# Patient Record
Sex: Male | Born: 1940 | Race: White | Hispanic: No | Smoking: Never smoker
Health system: Southern US, Community
[De-identification: ages and names within clinical notes are randomized; demographics above are authoritative.]

## PROBLEM LIST (undated history)

## (undated) DIAGNOSIS — K5730 Diverticulosis of large intestine without perforation or abscess without bleeding: Secondary | ICD-10-CM

## (undated) DIAGNOSIS — Z87828 Personal history of other (healed) physical injury and trauma: Secondary | ICD-10-CM

## (undated) DIAGNOSIS — N200 Calculus of kidney: Secondary | ICD-10-CM

## (undated) DIAGNOSIS — M1990 Unspecified osteoarthritis, unspecified site: Secondary | ICD-10-CM

## (undated) DIAGNOSIS — Z85828 Personal history of other malignant neoplasm of skin: Secondary | ICD-10-CM

## (undated) DIAGNOSIS — C61 Malignant neoplasm of prostate: Secondary | ICD-10-CM

## (undated) DIAGNOSIS — K219 Gastro-esophageal reflux disease without esophagitis: Secondary | ICD-10-CM

## (undated) DIAGNOSIS — I10 Essential (primary) hypertension: Secondary | ICD-10-CM

## (undated) DIAGNOSIS — N529 Male erectile dysfunction, unspecified: Secondary | ICD-10-CM

## (undated) DIAGNOSIS — R030 Elevated blood-pressure reading, without diagnosis of hypertension: Secondary | ICD-10-CM

## (undated) DIAGNOSIS — R911 Solitary pulmonary nodule: Secondary | ICD-10-CM

## (undated) DIAGNOSIS — H269 Unspecified cataract: Secondary | ICD-10-CM

## (undated) DIAGNOSIS — M722 Plantar fascial fibromatosis: Secondary | ICD-10-CM

## (undated) DIAGNOSIS — E785 Hyperlipidemia, unspecified: Secondary | ICD-10-CM

## (undated) DIAGNOSIS — Z9889 Other specified postprocedural states: Secondary | ICD-10-CM

## (undated) HISTORY — DX: Unspecified cataract: H26.9

## (undated) HISTORY — DX: Diverticulosis of large intestine without perforation or abscess without bleeding: K57.30

## (undated) HISTORY — DX: Malignant neoplasm of prostate: C61

## (undated) HISTORY — DX: Essential (primary) hypertension: I10

## (undated) HISTORY — PX: FRACTURE SURGERY: SHX138

## (undated) HISTORY — DX: Unspecified osteoarthritis, unspecified site: M19.90

## (undated) HISTORY — DX: Gastro-esophageal reflux disease without esophagitis: K21.9

## (undated) HISTORY — DX: Male erectile dysfunction, unspecified: N52.9

---
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
## 2003-10-01 HISTORY — PX: INGUINAL HERNIA REPAIR: SUR1180

## 2005-09-11 ENCOUNTER — Ambulatory Visit: Payer: Self-pay | Admitting: Pulmonary Disease

## 2006-09-12 ENCOUNTER — Ambulatory Visit: Payer: Self-pay | Admitting: Pulmonary Disease

## 2006-11-19 ENCOUNTER — Ambulatory Visit (HOSPITAL_COMMUNITY): Admission: RE | Admit: 2006-11-19 | Discharge: 2006-11-19 | Payer: Self-pay | Admitting: Pulmonary Disease

## 2006-11-19 ENCOUNTER — Ambulatory Visit: Payer: Self-pay | Admitting: Pulmonary Disease

## 2008-08-23 DIAGNOSIS — I10 Essential (primary) hypertension: Secondary | ICD-10-CM

## 2008-08-23 DIAGNOSIS — F528 Other sexual dysfunction not due to a substance or known physiological condition: Secondary | ICD-10-CM

## 2008-08-24 ENCOUNTER — Ambulatory Visit: Payer: Self-pay | Admitting: Pulmonary Disease

## 2008-08-24 DIAGNOSIS — J329 Chronic sinusitis, unspecified: Secondary | ICD-10-CM | POA: Insufficient documentation

## 2008-08-24 DIAGNOSIS — K5730 Diverticulosis of large intestine without perforation or abscess without bleeding: Secondary | ICD-10-CM | POA: Insufficient documentation

## 2008-08-24 DIAGNOSIS — E785 Hyperlipidemia, unspecified: Secondary | ICD-10-CM

## 2008-09-05 ENCOUNTER — Telehealth: Payer: Self-pay | Admitting: Pulmonary Disease

## 2008-09-28 ENCOUNTER — Ambulatory Visit: Payer: Self-pay | Admitting: Pulmonary Disease

## 2008-09-28 LAB — CONVERTED CEMR LAB
Fecal Occult Blood: NEGATIVE
OCCULT 2: NEGATIVE

## 2009-11-06 ENCOUNTER — Ambulatory Visit: Payer: Self-pay | Admitting: Pulmonary Disease

## 2009-11-09 ENCOUNTER — Ambulatory Visit: Payer: Self-pay | Admitting: Pulmonary Disease

## 2009-11-11 LAB — CONVERTED CEMR LAB
AST: 22 units/L (ref 0–37)
Alkaline Phosphatase: 84 units/L (ref 39–117)
BUN: 14 mg/dL (ref 6–23)
Chloride: 107 meq/L (ref 96–112)
Creatinine, Ser: 0.8 mg/dL (ref 0.4–1.5)
Eosinophils Absolute: 0.3 10*3/uL (ref 0.0–0.7)
Glucose, Bld: 84 mg/dL (ref 70–99)
LDL Cholesterol: 116 mg/dL — ABNORMAL HIGH (ref 0–99)
Lymphocytes Relative: 22.7 % (ref 12.0–46.0)
MCHC: 32.4 g/dL (ref 30.0–36.0)
MCV: 97 fL (ref 78.0–100.0)
Monocytes Relative: 9.3 % (ref 3.0–12.0)
Neutrophils Relative %: 62.8 % (ref 43.0–77.0)
Platelets: 198 10*3/uL (ref 150.0–400.0)
Potassium: 4.1 meq/L (ref 3.5–5.1)
RDW: 12.3 % (ref 11.5–14.6)
Total Bilirubin: 0.7 mg/dL (ref 0.3–1.2)
Total CHOL/HDL Ratio: 3
VLDL: 9.8 mg/dL (ref 0.0–40.0)
WBC: 6.4 10*3/uL (ref 4.5–10.5)

## 2010-05-02 ENCOUNTER — Encounter: Payer: Self-pay | Admitting: Gastroenterology

## 2010-06-19 ENCOUNTER — Encounter (INDEPENDENT_AMBULATORY_CARE_PROVIDER_SITE_OTHER): Payer: Self-pay | Admitting: *Deleted

## 2010-07-06 ENCOUNTER — Encounter (INDEPENDENT_AMBULATORY_CARE_PROVIDER_SITE_OTHER): Payer: Self-pay | Admitting: *Deleted

## 2010-07-10 ENCOUNTER — Ambulatory Visit: Payer: Self-pay | Admitting: Gastroenterology

## 2010-07-13 ENCOUNTER — Encounter: Payer: Self-pay | Admitting: Pulmonary Disease

## 2010-07-16 ENCOUNTER — Encounter: Payer: Self-pay | Admitting: Pulmonary Disease

## 2010-07-24 ENCOUNTER — Ambulatory Visit: Payer: Self-pay | Admitting: Gastroenterology

## 2010-09-21 ENCOUNTER — Ambulatory Visit: Payer: Self-pay | Admitting: Pulmonary Disease

## 2010-09-21 ENCOUNTER — Encounter: Payer: Self-pay | Admitting: Pulmonary Disease

## 2010-09-25 ENCOUNTER — Ambulatory Visit: Payer: Self-pay | Admitting: Pulmonary Disease

## 2010-10-02 LAB — CONVERTED CEMR LAB
AST: 22 units/L (ref 0–37)
Albumin: 3.8 g/dL (ref 3.5–5.2)
Alkaline Phosphatase: 65 units/L (ref 39–117)
Basophils Absolute: 0 10*3/uL (ref 0.0–0.1)
Bilirubin Urine: NEGATIVE
Bilirubin, Direct: 0.2 mg/dL (ref 0.0–0.3)
Eosinophils Absolute: 0.1 10*3/uL (ref 0.0–0.7)
GFR calc non Af Amer: 108.59 mL/min (ref >60.00)
Glucose, Bld: 74 mg/dL (ref 70–99)
HCT: 45.3 % (ref 39.0–52.0)
Ketones, ur: NEGATIVE mg/dL
Leukocytes, UA: NEGATIVE
Lymphs Abs: 1.6 10*3/uL (ref 0.7–4.0)
MCHC: 34.3 g/dL (ref 30.0–36.0)
MCV: 94.9 fL (ref 78.0–100.0)
Monocytes Absolute: 0.4 10*3/uL (ref 0.1–1.0)
Neutro Abs: 4.2 10*3/uL (ref 1.4–7.7)
Platelets: 210 10*3/uL (ref 150.0–400.0)
Potassium: 4.8 meq/L (ref 3.5–5.1)
RDW: 13.1 % (ref 11.5–14.6)
Sodium: 138 meq/L (ref 135–145)
TSH: 1.9 microintl units/mL (ref 0.35–5.50)
Total Bilirubin: 0.7 mg/dL (ref 0.3–1.2)
Total CHOL/HDL Ratio: 4
Urine Glucose: NEGATIVE mg/dL
Urobilinogen, UA: 0.2 (ref 0.0–1.0)
VLDL: 13.4 mg/dL (ref 0.0–40.0)

## 2010-10-28 LAB — CONVERTED CEMR LAB
Albumin: 4.1 g/dL (ref 3.5–5.2)
Alkaline Phosphatase: 60 units/L (ref 39–117)
BUN: 15 mg/dL (ref 6–23)
Bacteria, UA: NEGATIVE
Basophils Absolute: 0 10*3/uL (ref 0.0–0.1)
Basophils Relative: 0.2 % (ref 0.0–3.0)
Bilirubin, Direct: 0.2 mg/dL (ref 0.0–0.3)
CO2: 29 meq/L (ref 19–32)
Chloride: 106 meq/L (ref 96–112)
Cholesterol: 202 mg/dL (ref 0–200)
Direct LDL: 106.7 mg/dL
Eosinophils Absolute: 0.1 10*3/uL (ref 0.0–0.7)
GFR calc Af Amer: 125 mL/min
HDL: 62.8 mg/dL (ref >39.0)
Hemoglobin, Urine: NEGATIVE
Leukocytes, UA: NEGATIVE
Lymphocytes Relative: 31.4 % (ref 12.0–46.0)
MCHC: 34.2 g/dL (ref 30.0–36.0)
MCV: 93.7 fL (ref 78.0–100.0)
Monocytes Absolute: 0.5 10*3/uL (ref 0.1–1.0)
Monocytes Relative: 7.9 % (ref 3.0–12.0)
Neutro Abs: 3.7 10*3/uL (ref 1.4–7.7)
Nitrite: NEGATIVE
PSA: 2.3 ng/mL (ref 0.10–4.00)
Platelets: 190 10*3/uL (ref 150–400)
Potassium: 4 meq/L (ref 3.5–5.1)
RBC / HPF: NONE SEEN
Total Bilirubin: 1 mg/dL (ref 0.3–1.2)
Total Protein, Urine: NEGATIVE mg/dL
Total Protein: 6.7 g/dL (ref 6.0–8.3)
Triglycerides: 68 mg/dL (ref 0–149)
VLDL: 14 mg/dL (ref 0–40)
WBC: 6.2 10*3/uL (ref 4.5–10.5)

## 2010-10-31 NOTE — Letter (Signed)
Summary: Pre Visit Letter Revised  [facility]  [place], [place] [address]   Phone: [phone]  Fax: [phone]        06/19/2010 MRN: [id number] Nicholas Garza [address] [place],   [address]             Procedure Date:  07/24/2010   Welcome to the Gastroenterology Division at [facility].    You are scheduled to see a nurse for your pre-procedure visit on 07/10/2010 at 3:30PM on the 3rd floor at [facility] [employer].  We ask that you try to arrive at our office 15 minutes prior to your appointment time to allow for check-in.  Please take a minute to review the attached form.  If you answer "Yes" to one or more of the questions on the first page, we ask that you call the person listed at your earliest opportunity.  If you answer "No" to all of the questions, please complete the rest of the form and bring it to your appointment.    Your nurse visit will consist of discussing your medical and surgical history, your immediate family medical history, and your medications.   If you are unable to list all of your medications on the form, please bring the medication bottles to your appointment and we will list them.  We will need to be aware of both prescribed and over the counter drugs.  We will need to know exact dosage information as well.    Please be prepared to read and sign documents such as consent forms, a financial agreement, and acknowledgement forms.  If necessary, and with your consent, a friend or relative is welcome to sit-in on the nurse visit with you.  Please bring your insurance card so that we may make a copy of it.  If your insurance requires a referral to see a specialist, please bring your referral form from your primary care physician.  No co-pay is required for this nurse visit.     If you cannot keep your appointment, please call [phone] to cancel or reschedule prior to your appointment date.  This  allows [place] the opportunity to schedule an appointment for another patient in need of care.    Thank you for choosing [facility] for your medical needs.  We appreciate the opportunity to care for you.  Please visit [place] at our website  to learn more about our practice.  Sincerely, The Gastroenterology Division

## 2010-10-31 NOTE — Letter (Signed)
Summary: [facility] Instructions  [facility]  [place], [place] [address]   Phone: [phone]  Fax: [phone]       Nicholas Garza    [birth year]    MRN: [id number]        Procedure Day /Date:  Tuesday 07/24/2010     Arrival Time: 10:30 am      Procedure Time: 11:30 am     Location of Procedure:                    _x _  [facility] (4th Floor)                        [place]   Starting 5 days prior to your procedure Thursday 10/20 do not eat nuts, seeds, popcorn, corn, beans, peas,  salads, or any raw vegetables.  Do not take any fiber supplements (e.g. Metamucil, Citrucel, and Benefiber).  THE DAY BEFORE YOUR PROCEDURE         DATE: Monday 10/24  1.  Drink clear liquids the entire day-NO SOLID FOOD  2.  Do not drink anything colored red or purple.  Avoid juices with pulp.  No orange juice.  3.  Drink at least 64 oz. (8 glasses) of fluid/clear liquids during the day to prevent dehydration and help the prep work efficiently.  CLEAR LIQUIDS INCLUDE: Water Jello Ice Popsicles Tea (sugar ok, no milk/cream) Powdered fruit flavored drinks Coffee (sugar ok, no milk/cream) Gatorade Juice: apple, white grape, white cranberry  Lemonade Clear bullion, consomm, broth Carbonated beverages (any kind) Strained chicken noodle soup Hard Candy                             4.  In the morning, mix first dose of MoviPrep solution:    Empty 1 Pouch A and 1 Pouch B into the disposable container    Add lukewarm drinking water to the top line of the container. Mix to dissolve    Refrigerate (mixed solution should be used within 24 hrs)  5.  Begin drinking the prep at 5:00 p.m. The MoviPrep container is divided by 4 marks.   Every 15 minutes drink the solution down to the next mark (approximately 8 oz) until the full liter is complete.   6.  Follow completed prep with 16 oz of clear liquid of your choice  (Nothing red or purple).  Continue to drink clear liquids until bedtime.  7.  Before going to bed, mix second dose of MoviPrep solution:    Empty 1 Pouch A and 1 Pouch B into the disposable container    Add lukewarm drinking water to the top line of the container. Mix to dissolve    Refrigerate  THE DAY OF YOUR PROCEDURE      DATE: Tuesday 10/25  Beginning at 6:30 a.m. (5 hours before procedure):         1. Every 15 minutes, drink the solution down to the next mark (approx 8 oz) until the full liter is complete.  2. Follow completed prep with 16 oz. of clear liquid of your choice.    3. You may drink clear liquids until 9:30 am (2 HOURS BEFORE PROCEDURE).   MEDICATION INSTRUCTIONS  Unless otherwise instructed, you should take regular prescription medications with a small sip of water   as early as possible the morning of  your procedure.           OTHER INSTRUCTIONS  You will need a responsible adult at least 68 years of age to accompany you and drive you home.   This person must remain in the waiting room during your procedure.  Wear loose fitting clothing that is easily removed.  Leave jewelry and other valuables at home.  However, you may wish to bring a book to read or  an iPod/MP3 player to listen to music as you wait for your procedure to start.  Remove all body piercing jewelry and leave at home.  Total time from sign-in until discharge is approximately 2-3 hours.  You should go home directly after your procedure and rest.  You can resume normal activities the  day after your procedure.  The day of your procedure you should not:   Drive   Make legal decisions   Operate machinery   Drink alcohol   Return to work  You will receive specific instructions about eating, activities and medications before you leave.    The above instructions have been reviewed and explained to me by   Ulice Dash RN  July 10, 2010 3:18 PM     I fully understand  and can verbalize these instructions _____________________________ Date _________

## 2010-10-31 NOTE — Miscellaneous (Signed)
Summary: [facility]  Clinical Lists Changes  Medications: Added new medication of MOVIPREP 100 GM  SOLR (PEG-KCL-NACL-NASULF-NA ASC-C) As per prep instructions. - Signed Rx of MOVIPREP 100 GM  SOLR (PEG-KCL-NACL-NASULF-NA ASC-C) As per prep instructions.;  #1 x 0;  Signed;  Entered by: Ulice Dash RN;  Authorized by: Inda Castle MD;  Method used: Electronically to [facility] #[id number]*, [address] [place], [place]  [address], Ph: [phone], Fax: [phone] Observations: Added new observation of NKA: T (07/10/2010 14:18)    Prescriptions: MOVIPREP 100 GM  SOLR (PEG-KCL-NACL-NASULF-NA ASC-C) As per prep instructions.  #1 x 0   Entered by:   Ulice Dash RN   Authorized by:   Inda Castle MD   Signed by:   Ulice Dash RN on 07/10/2010   Method used:   Electronically to        [id number]* (retail)       [place], [place]  [address]       Ph: [phone]       Fax: [phone]   RxID:   [id number]

## 2010-10-31 NOTE — Miscellaneous (Signed)
Summary: Flu Vaccine/[facility]  Flu Vaccine/[facility]   Imported By: Jerrye Noble D'jimraou 07/21/2010 10:37:17  _____________________________________________________________________  External Attachment:    Type:   Image     Comment:   External Document

## 2010-10-31 NOTE — Miscellaneous (Signed)
Summary: flu vaccine  Clinical Lists Changes  Observations: Added new observation of FLU VAX: Historical (07/13/2010 16:42)      Immunization History:  Influenza Immunization History:    Influenza:  historical (07/13/2010)
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
## 2010-10-31 NOTE — Letter (Signed)
Summary: Colonoscopy Letter  [facility]  [place], [place] [address]   Phone: [phone]  Fax: [phone]      May 02, 2010 MRN: [id number]   [place], [place]  [address]   Dear Nicholas Garza,   According to your medical record, it is time for you to schedule a Colonoscopy. The American Cancer Society recommends this procedure as a method to detect early colon cancer. Patients with a family history of colon cancer, or a personal history of colon polyps or inflammatory bowel disease are at increased risk.  This letter has been generated based on the recommendations made at the time of your procedure. If you feel that in your particular situation this may no longer apply, please contact our office.  Please call our office at [phone] to schedule this appointment or to update your records at your earliest convenience.  Thank you for cooperating with [place] to provide you with the very best care possible.   Sincerely,   Sandy Salaam. Deatra Ina, M.D.  [facility] Gastroenterology Division [phone]

## 2010-10-31 NOTE — Procedures (Signed)
Summary: Colonoscopy  Patient: [name] Note: All result statuses are Final unless otherwise noted.  Tests: (1) Colonoscopy (COL)   COL Colonoscopy           [facility]     [place], [place]  [address]           COLONOSCOPY PROCEDURE REPORT           PATIENT:  Nicholas Garza, Nicholas Garza  MR#:  [id number]     BIRTHDATE:  05/30/1941, 69 yrs. old  GENDER:  male           ENDOSCOPIST:  Sandy Salaam. Deatra Ina, MD     Referred by:           PROCEDURE DATE:  07/24/2010     PROCEDURE:  Diagnostic Colonoscopy     ASA CLASS:  Class II     INDICATIONS:  1) Routine Risk Screening           MEDICATIONS:   Fentanyl 50 mcg IV, Versed 7 mg IV           DESCRIPTION OF PROCEDURE:   After the risks benefits and     alternatives of the procedure were thoroughly explained, informed     consent was obtained.  Digital rectal exam was performed and     revealed no abnormalities.   The [id number] endoscope was     introduced through the anus and advanced to the cecum, which was     identified by both the appendix and ileocecal valve, without     limitations.  The quality of the prep was excellent, using     MoviPrep.  The instrument was then slowly withdrawn as the colon     was fully examined.     <<PROCEDUREIMAGES>>           FINDINGS:  Severe diverticulosis was found in the sigmoid colon     (see image10). Lumenal narrowing, muscular hypertrophy  A lipoma     was found in the ascending colon (see image3).  This was otherwise     a normal examination of the colon (see image1, image5, image6,     image11, and image12).   Retroflexed views in the rectum revealed     no abnormalities.    The time to cecum =  6.25  minutes. The scope     was then withdrawn (time =  5.0  min) from the patient and the     procedure completed.           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           REPEAT EXAM:   10 year(s) Colonoscopy           ______________________________     Sandy Salaam. Deatra Ina, MD           CC: Noralee Space, MD           n.     [name]:   Sandy Salaam. [name] at 07/24/2010 12:25 PM  Nicholas Garza, Nicholas Garza, [id number]  Note: An exclamation mark (!) indicates a result that was not dispersed into the flowsheet. Document Creation Date: 07/24/2010 12:25 PM _______________________________________________________________________  (1) Order result status: Final Collection or observation date-time: 07/24/2010 12:19 Requested date-time:  Receipt date-time:  Reported date-time:  Referring Physician:   Ordering Physician: Erskine Emery [phone]) Specimen Source:  Source: [name] Order Number: [id number] Lab site:   Appended Document: Colonoscopy    Clinical Lists Changes  Observations: Added new observation of COLONNXTDUE: 06/2020 (07/24/2010 14:47)
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
## 2011-02-08 ENCOUNTER — Telehealth: Payer: Self-pay | Admitting: Pulmonary Disease

## 2011-02-08 DIAGNOSIS — R972 Elevated prostate specific antigen [PSA]: Secondary | ICD-10-CM

## 2011-02-08 NOTE — Telephone Encounter (Signed)
[email or web address]

## 2011-02-11 NOTE — Telephone Encounter (Signed)
lmomtcb x1 

## 2011-02-12 NOTE — Telephone Encounter (Signed)
Per 09-27-10 lab notes pt to come in for recheck of labs in 6 months. lmomtcb x 1 need to inform pt that we will need him to call back the last week of June for labs.

## 2011-02-13 NOTE — Telephone Encounter (Signed)
lmomtcb  

## 2011-02-14 NOTE — Telephone Encounter (Signed)
lmomtcb  

## 2011-02-15 DIAGNOSIS — R972 Elevated prostate specific antigen [PSA]: Secondary | ICD-10-CM | POA: Insufficient documentation

## 2011-02-15 NOTE — Telephone Encounter (Signed)
Pt calling again to have his labs scheduled can be reached at [phone].Netta Neat

## 2011-02-15 NOTE — Telephone Encounter (Signed)
psa placed in the computer for pt to return in June for repeat level.  thanks

## 2011-02-15 NOTE — Telephone Encounter (Signed)
Spoke with pt and notified that he can come the last wk in June for repeat labs. Pt verbalized understanding. Pls advise what labs to order thanks

## 2011-02-15 NOTE — Telephone Encounter (Signed)
lmomtcb  

## 2011-02-15 NOTE — Telephone Encounter (Signed)
LMTCB

## 2011-03-29 ENCOUNTER — Other Ambulatory Visit (INDEPENDENT_AMBULATORY_CARE_PROVIDER_SITE_OTHER): Payer: Medicare Other

## 2011-03-29 DIAGNOSIS — R972 Elevated prostate specific antigen [PSA]: Secondary | ICD-10-CM

## 2011-04-01 ENCOUNTER — Telehealth: Payer: Self-pay | Admitting: Pulmonary Disease

## 2011-04-01 NOTE — Telephone Encounter (Signed)
Spoke with pt and notified of results/recs regarding PSA. Pt verbalized understanding.

## 2011-07-24 ENCOUNTER — Telehealth: Payer: Self-pay | Admitting: Pulmonary Disease

## 2011-07-24 DIAGNOSIS — M25561 Pain in right knee: Secondary | ICD-10-CM

## 2011-07-24 NOTE — Telephone Encounter (Signed)
Called and spoke with pt.  Pt c/o R knee pain x 3 weeks.  States the pain is on the inside of his leg, right below knee cap.  States he doesn't hurt when walking on it, hurts mostly when sitting or driving in a car.  Pt doesn't recall hurting his knee.  States he can't bend it well but doesn't notice much swelling. Pt has taken OTC Advil for pain with little relief of symptoms.   Pt hasn't seen an orthopedic MD before but would like SN's recs and possibly referral to one. Pt aware SN has left for the day and is ok to wait for a response tomorrow.  Sn, please advise.  Thanks.

## 2011-07-25 NOTE — Telephone Encounter (Signed)
Called and spoke with pt.  Pt ok'd to see ortho at [facility] that SN normally refers to.   Order sent to [facility].  Pt aware.

## 2011-07-25 NOTE — Telephone Encounter (Signed)
Per SN---SN normally uses [facility] ortho but pt may want a different ortho.  If he can give [place] a name then we can refer him to ortho for his knee pain.  thanks

## 2011-12-10 ENCOUNTER — Ambulatory Visit (INDEPENDENT_AMBULATORY_CARE_PROVIDER_SITE_OTHER): Payer: Medicare Other | Admitting: Pulmonary Disease

## 2011-12-10 ENCOUNTER — Encounter: Payer: Self-pay | Admitting: Pulmonary Disease

## 2011-12-10 VITALS — BP 132/80 | HR 70 | Temp 98.0°F | Resp 16 | Ht 74.0 in | Wt 235.0 lb

## 2011-12-10 DIAGNOSIS — K5730 Diverticulosis of large intestine without perforation or abscess without bleeding: Secondary | ICD-10-CM

## 2011-12-10 DIAGNOSIS — F528 Other sexual dysfunction not due to a substance or known physiological condition: Secondary | ICD-10-CM

## 2011-12-10 DIAGNOSIS — E785 Hyperlipidemia, unspecified: Secondary | ICD-10-CM

## 2011-12-10 DIAGNOSIS — I10 Essential (primary) hypertension: Secondary | ICD-10-CM

## 2011-12-10 NOTE — Patient Instructions (Signed)
Today we updated your med list in our EPIC system...    Continue your current medications the same...  Please return to our lab one morning this week for your follow up fasting blood work...    Then call the PHONE TREE in a few days for your results...    Dial [id number] & when prompted enter your patient number followed by the # symbol...    Your patient number is:  [phone]  Call for any questions.Marland KitchenMarland Kitchen
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
## 2011-12-11 ENCOUNTER — Other Ambulatory Visit (INDEPENDENT_AMBULATORY_CARE_PROVIDER_SITE_OTHER): Payer: Medicare Other

## 2011-12-11 DIAGNOSIS — R972 Elevated prostate specific antigen [PSA]: Secondary | ICD-10-CM

## 2011-12-11 DIAGNOSIS — F528 Other sexual dysfunction not due to a substance or known physiological condition: Secondary | ICD-10-CM

## 2011-12-11 DIAGNOSIS — I10 Essential (primary) hypertension: Secondary | ICD-10-CM

## 2011-12-11 DIAGNOSIS — E785 Hyperlipidemia, unspecified: Secondary | ICD-10-CM

## 2011-12-11 LAB — LIPID PANEL
HDL: 53.6 mg/dL (ref >39.00)
LDL Cholesterol: 125 mg/dL — ABNORMAL HIGH (ref 0–99)
VLDL: 11.2 mg/dL (ref 0.0–40.0)

## 2011-12-11 LAB — BASIC METABOLIC PANEL
Calcium: 9 mg/dL (ref 8.4–10.5)
GFR: 125.14 mL/min (ref >60.00)
Potassium: 4.6 mEq/L (ref 3.5–5.1)
Sodium: 139 mEq/L (ref 135–145)

## 2011-12-11 LAB — URINALYSIS
Bilirubin Urine: NEGATIVE
Nitrite: NEGATIVE
Specific Gravity, Urine: 1.01 (ref 1.000–1.030)
Total Protein, Urine: NEGATIVE
pH: 7 (ref 5.0–8.0)

## 2011-12-11 LAB — HEPATIC FUNCTION PANEL
Bilirubin, Direct: 0.1 mg/dL (ref 0.0–0.3)
Total Bilirubin: 0.4 mg/dL (ref 0.3–1.2)

## 2011-12-11 LAB — CBC WITH DIFFERENTIAL/PLATELET
Basophils Relative: 0.5 % (ref 0.0–3.0)
Eosinophils Absolute: 0.1 10*3/uL (ref 0.0–0.7)
Eosinophils Relative: 1.7 % (ref 0.0–5.0)
Hemoglobin: 15.6 g/dL (ref 13.0–17.0)
Lymphocytes Relative: 31.1 % (ref 12.0–46.0)
MCHC: 33.8 g/dL (ref 30.0–36.0)
MCV: 95 fl (ref 78.0–100.0)
Neutro Abs: 3.2 10*3/uL (ref 1.4–7.7)
RBC: 4.84 Mil/uL (ref 4.22–5.81)

## 2012-05-08 DIAGNOSIS — L719 Rosacea, unspecified: Secondary | ICD-10-CM | POA: Insufficient documentation

## 2013-02-19 ENCOUNTER — Ambulatory Visit (INDEPENDENT_AMBULATORY_CARE_PROVIDER_SITE_OTHER): Payer: Medicare Other | Admitting: Pulmonary Disease

## 2013-02-19 ENCOUNTER — Encounter: Payer: Self-pay | Admitting: Pulmonary Disease

## 2013-02-19 ENCOUNTER — Other Ambulatory Visit: Payer: Self-pay | Admitting: Pulmonary Disease

## 2013-02-19 ENCOUNTER — Other Ambulatory Visit (INDEPENDENT_AMBULATORY_CARE_PROVIDER_SITE_OTHER): Payer: Medicare Other

## 2013-02-19 VITALS — BP 132/80 | HR 65 | Temp 97.0°F | Ht 74.0 in | Wt 219.4 lb

## 2013-02-19 DIAGNOSIS — I10 Essential (primary) hypertension: Secondary | ICD-10-CM

## 2013-02-19 DIAGNOSIS — R972 Elevated prostate specific antigen [PSA]: Secondary | ICD-10-CM

## 2013-02-19 DIAGNOSIS — F528 Other sexual dysfunction not due to a substance or known physiological condition: Secondary | ICD-10-CM

## 2013-02-19 DIAGNOSIS — K5730 Diverticulosis of large intestine without perforation or abscess without bleeding: Secondary | ICD-10-CM

## 2013-02-19 DIAGNOSIS — E785 Hyperlipidemia, unspecified: Secondary | ICD-10-CM

## 2013-02-19 DIAGNOSIS — F411 Generalized anxiety disorder: Secondary | ICD-10-CM

## 2013-02-19 LAB — URINALYSIS
Bilirubin Urine: NEGATIVE
Ketones, ur: NEGATIVE
Leukocytes, UA: NEGATIVE
Specific Gravity, Urine: 1.015 (ref 1.000–1.030)
Total Protein, Urine: NEGATIVE
pH: 8 (ref 5.0–8.0)

## 2013-02-19 LAB — CBC WITH DIFFERENTIAL/PLATELET
Basophils Relative: 0.5 % (ref 0.0–3.0)
Eosinophils Absolute: 0.1 10*3/uL (ref 0.0–0.7)
Eosinophils Relative: 1.2 % (ref 0.0–5.0)
HCT: 42.4 % (ref 39.0–52.0)
Hemoglobin: 14.8 g/dL (ref 13.0–17.0)
Lymphs Abs: 1.8 10*3/uL (ref 0.7–4.0)
MCHC: 35 g/dL (ref 30.0–36.0)
MCV: 92.7 fl (ref 78.0–100.0)
Monocytes Absolute: 0.5 10*3/uL (ref 0.1–1.0)
Neutro Abs: 2.8 10*3/uL (ref 1.4–7.7)
Neutrophils Relative %: 53.6 % (ref 43.0–77.0)
RBC: 4.57 Mil/uL (ref 4.22–5.81)
WBC: 5.3 10*3/uL (ref 4.5–10.5)

## 2013-02-19 LAB — HEPATIC FUNCTION PANEL
ALT: 21 U/L (ref 0–53)
Bilirubin, Direct: 0.1 mg/dL (ref 0.0–0.3)
Total Bilirubin: 0.7 mg/dL (ref 0.3–1.2)

## 2013-02-19 LAB — PSA: PSA: 3.87 ng/mL (ref 0.10–4.00)

## 2013-02-19 LAB — LIPID PANEL
Cholesterol: 160 mg/dL (ref 0–200)
LDL Cholesterol: 101 mg/dL — ABNORMAL HIGH (ref 0–99)
Total CHOL/HDL Ratio: 3
Triglycerides: 44 mg/dL (ref 0.0–149.0)
VLDL: 8.8 mg/dL (ref 0.0–40.0)

## 2013-02-19 LAB — BASIC METABOLIC PANEL
BUN: 15 mg/dL (ref 6–23)
Chloride: 105 mEq/L (ref 96–112)
Creatinine, Ser: 0.8 mg/dL (ref 0.4–1.5)
GFR: 107.82 mL/min (ref >60.00)

## 2013-02-19 MED ORDER — CLOTRIMAZOLE 1 % EX CREA: TOPICAL_CREAM | Freq: Two times a day (BID) | CUTANEOUS | Status: DC

## 2013-02-19 MED ORDER — SILDENAFIL CITRATE 100 MG PO TABS: 100.0000 mg | ORAL_TABLET | Freq: Every day | ORAL | Status: DC | PRN
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
## 2013-02-19 NOTE — Patient Instructions (Addendum)
Today we updated your med list in our EPIC system...    Continue your current medications the same...  We wrote a new prescription for CLOTRIMAZOLE cream 1% to apply to your athlete's foot fungus up to twice daily...  Today we did your follow up fasting blood work...    We will contact you w/ the results when available...   Call for any questions...  Let's plan a follow up visit in 36yr sooner if needed for problems..Marland KitchenMarland Kitchen

## 2013-03-10 NOTE — Progress Notes (Signed)
Quick Note:  Pt aware of results. No questions or concerns at this time. ______

## 2014-04-04 ENCOUNTER — Telehealth: Payer: Self-pay | Admitting: Pulmonary Disease

## 2014-04-04 NOTE — Telephone Encounter (Signed)
Spoke with pt who requested OV for physical with SN. Spoke with Leigh and per her Ok to schedule. Appointment made and pt aware nothing further needed

## 2014-04-06 ENCOUNTER — Encounter: Payer: Self-pay | Admitting: Pulmonary Disease

## 2014-04-06 ENCOUNTER — Ambulatory Visit (INDEPENDENT_AMBULATORY_CARE_PROVIDER_SITE_OTHER): Payer: Medicare Other | Admitting: Pulmonary Disease

## 2014-04-06 ENCOUNTER — Ambulatory Visit (INDEPENDENT_AMBULATORY_CARE_PROVIDER_SITE_OTHER)
Admission: RE | Admit: 2014-04-06 | Discharge: 2014-04-06 | Disposition: A | Discharge status: Home / Self Care | Payer: Medicare Other | Source: Ambulatory Visit | Attending: Pulmonary Disease | Admitting: Pulmonary Disease

## 2014-04-06 VITALS — BP 128/80 | HR 66 | Temp 97.8°F | Ht 74.0 in | Wt 204.2 lb

## 2014-04-06 DIAGNOSIS — F419 Anxiety disorder, unspecified: Secondary | ICD-10-CM

## 2014-04-06 DIAGNOSIS — I10 Essential (primary) hypertension: Secondary | ICD-10-CM

## 2014-04-06 DIAGNOSIS — R972 Elevated prostate specific antigen [PSA]: Secondary | ICD-10-CM

## 2014-04-06 DIAGNOSIS — K5730 Diverticulosis of large intestine without perforation or abscess without bleeding: Secondary | ICD-10-CM

## 2014-04-06 DIAGNOSIS — E785 Hyperlipidemia, unspecified: Secondary | ICD-10-CM

## 2014-04-06 DIAGNOSIS — F411 Generalized anxiety disorder: Secondary | ICD-10-CM

## 2014-04-06 MED ORDER — SILDENAFIL CITRATE 100 MG PO TABS: 100.0000 mg | ORAL_TABLET | Freq: Every day | ORAL | Status: DC | PRN

## 2014-04-06 NOTE — Patient Instructions (Signed)
Today we updated your med list in our EPIC system...    Continue your current medications the same...    We refilled your meds per request...  You are up to date on your colonoscopy and vaccines...    We wrote a prescription for the Shingles vaccine (if you chose to have this one)...  Today we did your follow up CXR & EKG... Please return to our lab one morning soon for your FASTING blood work...    We will contact you w/ the results when available...   Keep up the great job w/ diet & exercise...  Call for any questions.Marland KitchenMarland Kitchen

## 2014-04-07 ENCOUNTER — Encounter: Payer: Self-pay | Admitting: Pulmonary Disease
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
## 2014-04-08 ENCOUNTER — Other Ambulatory Visit (INDEPENDENT_AMBULATORY_CARE_PROVIDER_SITE_OTHER): Payer: Medicare Other

## 2014-04-08 DIAGNOSIS — F411 Generalized anxiety disorder: Secondary | ICD-10-CM

## 2014-04-08 DIAGNOSIS — K5730 Diverticulosis of large intestine without perforation or abscess without bleeding: Secondary | ICD-10-CM

## 2014-04-08 DIAGNOSIS — R972 Elevated prostate specific antigen [PSA]: Secondary | ICD-10-CM

## 2014-04-08 DIAGNOSIS — I10 Essential (primary) hypertension: Secondary | ICD-10-CM

## 2014-04-08 DIAGNOSIS — E785 Hyperlipidemia, unspecified: Secondary | ICD-10-CM

## 2014-04-08 DIAGNOSIS — F419 Anxiety disorder, unspecified: Secondary | ICD-10-CM

## 2014-04-08 LAB — BASIC METABOLIC PANEL
BUN: 16 mg/dL (ref 6–23)
CALCIUM: 8.9 mg/dL (ref 8.4–10.5)
CO2: 29 mEq/L (ref 19–32)
CREATININE: 0.9 mg/dL (ref 0.4–1.5)
Chloride: 106 mEq/L (ref 96–112)
GFR: 94.45 mL/min (ref >60.00)
Glucose, Bld: 86 mg/dL (ref 70–99)
Potassium: 4.8 mEq/L (ref 3.5–5.1)
Sodium: 140 mEq/L (ref 135–145)

## 2014-04-08 LAB — CBC WITH DIFFERENTIAL/PLATELET
BASOS PCT: 0.3 % (ref 0.0–3.0)
Basophils Absolute: 0 10*3/uL (ref 0.0–0.1)
EOS PCT: 1.2 % (ref 0.0–5.0)
Eosinophils Absolute: 0.1 10*3/uL (ref 0.0–0.7)
HCT: 43.3 % (ref 39.0–52.0)
Hemoglobin: 14.4 g/dL (ref 13.0–17.0)
Lymphocytes Relative: 28.5 % (ref 12.0–46.0)
Lymphs Abs: 1.7 10*3/uL (ref 0.7–4.0)
MCHC: 33.3 g/dL (ref 30.0–36.0)
MCV: 94.8 fl (ref 78.0–100.0)
MONO ABS: 0.5 10*3/uL (ref 0.1–1.0)
Monocytes Relative: 8.5 % (ref 3.0–12.0)
NEUTROS PCT: 61.5 % (ref 43.0–77.0)
Neutro Abs: 3.7 10*3/uL (ref 1.4–7.7)
Platelets: 196 10*3/uL (ref 150.0–400.0)
RBC: 4.57 Mil/uL (ref 4.22–5.81)
RDW: 13.3 % (ref 11.5–15.5)
WBC: 6 10*3/uL (ref 4.0–10.5)

## 2014-04-08 LAB — LIPID PANEL
Cholesterol: 166 mg/dL (ref 0–200)
HDL: 53.3 mg/dL (ref >39.00)
LDL Cholesterol: 105 mg/dL — ABNORMAL HIGH (ref 0–99)
NONHDL: 112.7
TRIGLYCERIDES: 41 mg/dL (ref 0.0–149.0)
Total CHOL/HDL Ratio: 3
VLDL: 8.2 mg/dL (ref 0.0–40.0)

## 2014-04-08 LAB — HEPATIC FUNCTION PANEL
ALBUMIN: 3.8 g/dL (ref 3.5–5.2)
ALT: 18 U/L (ref 0–53)
AST: 20 U/L (ref 0–37)
Alkaline Phosphatase: 65 U/L (ref 39–117)
BILIRUBIN TOTAL: 0.6 mg/dL (ref 0.2–1.2)
Bilirubin, Direct: 0.1 mg/dL (ref 0.0–0.3)
Total Protein: 6 g/dL (ref 6.0–8.3)

## 2014-04-08 LAB — PSA: PSA: 4.03 ng/mL — AB (ref 0.10–4.00)

## 2014-04-08 LAB — TSH: TSH: 1.67 u[IU]/mL (ref 0.35–4.50)

## 2014-04-15 ENCOUNTER — Telehealth: Payer: Self-pay | Admitting: Pulmonary Disease

## 2014-04-15 NOTE — Telephone Encounter (Signed)
Called and spoke with pt and he is aware of ekg, cxr and lab results per SN.  Pt voiced his understanding and is aware to call back in 6 months for repeat PSA level.  Reminder has been sent as well.

## 2014-10-03 DIAGNOSIS — H524 Presbyopia: Secondary | ICD-10-CM | POA: Diagnosis not present

## 2014-10-03 DIAGNOSIS — H43813 Vitreous degeneration, bilateral: Secondary | ICD-10-CM | POA: Diagnosis not present

## 2014-11-06 DIAGNOSIS — S61012A Laceration without foreign body of left thumb without damage to nail, initial encounter: Secondary | ICD-10-CM | POA: Diagnosis not present

## 2014-11-06 DIAGNOSIS — W228XXA Striking against or struck by other objects, initial encounter: Secondary | ICD-10-CM | POA: Diagnosis not present

## 2015-04-24 ENCOUNTER — Encounter: Payer: Self-pay | Admitting: Gastroenterology

## 2015-07-13 DIAGNOSIS — Z23 Encounter for immunization: Secondary | ICD-10-CM | POA: Diagnosis not present

## 2015-08-04 DIAGNOSIS — M76822 Posterior tibial tendinitis, left leg: Secondary | ICD-10-CM | POA: Diagnosis not present

## 2015-08-04 DIAGNOSIS — M7752 Other enthesopathy of left foot: Secondary | ICD-10-CM | POA: Diagnosis not present

## 2015-08-04 DIAGNOSIS — M722 Plantar fascial fibromatosis: Secondary | ICD-10-CM | POA: Diagnosis not present

## 2015-09-13 DIAGNOSIS — M722 Plantar fascial fibromatosis: Secondary | ICD-10-CM | POA: Diagnosis not present

## 2015-09-13 DIAGNOSIS — M76822 Posterior tibial tendinitis, left leg: Secondary | ICD-10-CM | POA: Diagnosis not present

## 2015-09-13 DIAGNOSIS — M7752 Other enthesopathy of left foot: Secondary | ICD-10-CM | POA: Diagnosis not present

## 2015-12-25 DIAGNOSIS — H43813 Vitreous degeneration, bilateral: Secondary | ICD-10-CM | POA: Diagnosis not present

## 2015-12-25 DIAGNOSIS — H524 Presbyopia: Secondary | ICD-10-CM | POA: Diagnosis not present

## 2016-02-21 DIAGNOSIS — M7752 Other enthesopathy of left foot: Secondary | ICD-10-CM | POA: Diagnosis not present

## 2016-02-21 DIAGNOSIS — M722 Plantar fascial fibromatosis: Secondary | ICD-10-CM | POA: Diagnosis not present

## 2016-02-28 DIAGNOSIS — B078 Other viral warts: Secondary | ICD-10-CM | POA: Diagnosis not present

## 2016-02-28 DIAGNOSIS — L821 Other seborrheic keratosis: Secondary | ICD-10-CM | POA: Diagnosis not present

## 2016-02-28 DIAGNOSIS — Z08 Encounter for follow-up examination after completed treatment for malignant neoplasm: Secondary | ICD-10-CM | POA: Diagnosis not present

## 2016-02-28 DIAGNOSIS — Z85828 Personal history of other malignant neoplasm of skin: Secondary | ICD-10-CM | POA: Diagnosis not present

## 2016-02-28 DIAGNOSIS — L700 Acne vulgaris: Secondary | ICD-10-CM | POA: Diagnosis not present

## 2016-03-08 DIAGNOSIS — J018 Other acute sinusitis: Secondary | ICD-10-CM | POA: Diagnosis not present

## 2016-03-08 DIAGNOSIS — J302 Other seasonal allergic rhinitis: Secondary | ICD-10-CM | POA: Diagnosis not present

## 2016-03-08 DIAGNOSIS — R05 Cough: Secondary | ICD-10-CM | POA: Diagnosis not present

## 2016-03-21 DIAGNOSIS — M722 Plantar fascial fibromatosis: Secondary | ICD-10-CM | POA: Diagnosis not present

## 2016-03-21 DIAGNOSIS — M76822 Posterior tibial tendinitis, left leg: Secondary | ICD-10-CM | POA: Diagnosis not present

## 2016-03-21 DIAGNOSIS — M7752 Other enthesopathy of left foot: Secondary | ICD-10-CM | POA: Diagnosis not present

## 2016-03-26 ENCOUNTER — Encounter: Payer: Self-pay | Admitting: Pulmonary Disease

## 2016-03-26 ENCOUNTER — Ambulatory Visit (INDEPENDENT_AMBULATORY_CARE_PROVIDER_SITE_OTHER)
Admission: RE | Admit: 2016-03-26 | Discharge: 2016-03-26 | Disposition: A | Discharge status: Home / Self Care | Payer: Medicare Other | Source: Ambulatory Visit | Attending: Pulmonary Disease | Admitting: Pulmonary Disease

## 2016-03-26 ENCOUNTER — Other Ambulatory Visit: Payer: Medicare Other

## 2016-03-26 ENCOUNTER — Ambulatory Visit (INDEPENDENT_AMBULATORY_CARE_PROVIDER_SITE_OTHER): Payer: Medicare Other | Admitting: Pulmonary Disease

## 2016-03-26 VITALS — BP 124/74 | HR 75 | Temp 98.4°F | Resp 16 | Ht 74.0 in | Wt 203.0 lb

## 2016-03-26 DIAGNOSIS — E785 Hyperlipidemia, unspecified: Secondary | ICD-10-CM

## 2016-03-26 DIAGNOSIS — K5730 Diverticulosis of large intestine without perforation or abscess without bleeding: Secondary | ICD-10-CM | POA: Diagnosis not present

## 2016-03-26 DIAGNOSIS — J069 Acute upper respiratory infection, unspecified: Secondary | ICD-10-CM

## 2016-03-26 DIAGNOSIS — I10 Essential (primary) hypertension: Secondary | ICD-10-CM

## 2016-03-26 DIAGNOSIS — R972 Elevated prostate specific antigen [PSA]: Secondary | ICD-10-CM | POA: Diagnosis not present

## 2016-03-26 DIAGNOSIS — Z0000 Encounter for general adult medical examination without abnormal findings: Secondary | ICD-10-CM | POA: Diagnosis not present
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
## 2016-03-26 NOTE — Patient Instructions (Signed)
Today we updated your med list in our EPIC system...    Continue your current medications the same...  Today we rechecked your CXR... Please return to our lab in the AM for your FASTING blood work...    We will contact you w/ the results when available...   Keep up the good work w/ your diet & exercise program...  Call for any questions...  Let's plan a follow up visit in 68yr sooner if needed for any problems..Marland KitchenMarland Kitchen

## 2016-03-27 ENCOUNTER — Telehealth: Payer: Self-pay | Admitting: Pulmonary Disease

## 2016-03-27 ENCOUNTER — Other Ambulatory Visit (INDEPENDENT_AMBULATORY_CARE_PROVIDER_SITE_OTHER): Payer: Medicare Other

## 2016-03-27 DIAGNOSIS — E785 Hyperlipidemia, unspecified: Secondary | ICD-10-CM | POA: Diagnosis not present

## 2016-03-27 DIAGNOSIS — R972 Elevated prostate specific antigen [PSA]: Secondary | ICD-10-CM | POA: Diagnosis not present

## 2016-03-27 DIAGNOSIS — I10 Essential (primary) hypertension: Secondary | ICD-10-CM

## 2016-03-27 DIAGNOSIS — K5730 Diverticulosis of large intestine without perforation or abscess without bleeding: Secondary | ICD-10-CM | POA: Diagnosis not present

## 2016-03-27 LAB — COMPREHENSIVE METABOLIC PANEL
ALK PHOS: 85 U/L (ref 39–117)
ALT: 15 U/L (ref 0–53)
AST: 16 U/L (ref 0–37)
Albumin: 3.7 g/dL (ref 3.5–5.2)
BUN: 17 mg/dL (ref 6–23)
CHLORIDE: 104 meq/L (ref 96–112)
CO2: 30 meq/L (ref 19–32)
Calcium: 9 mg/dL (ref 8.4–10.5)
Creatinine, Ser: 0.76 mg/dL (ref 0.40–1.50)
GFR: 106.87 mL/min (ref >60.00)
GLUCOSE: 96 mg/dL (ref 70–99)
POTASSIUM: 5.1 meq/L (ref 3.5–5.1)
SODIUM: 139 meq/L (ref 135–145)
TOTAL PROTEIN: 6.2 g/dL (ref 6.0–8.3)
Total Bilirubin: 0.5 mg/dL (ref 0.2–1.2)

## 2016-03-27 LAB — CBC WITH DIFFERENTIAL/PLATELET
BASOS ABS: 0 10*3/uL (ref 0.0–0.1)
Basophils Relative: 0.3 % (ref 0.0–3.0)
Eosinophils Absolute: 0.2 10*3/uL (ref 0.0–0.7)
Eosinophils Relative: 3.2 % (ref 0.0–5.0)
HCT: 41.7 % (ref 39.0–52.0)
Hemoglobin: 13.7 g/dL (ref 13.0–17.0)
LYMPHS ABS: 1.8 10*3/uL (ref 0.7–4.0)
Lymphocytes Relative: 28.3 % (ref 12.0–46.0)
MCHC: 33 g/dL (ref 30.0–36.0)
MCV: 94 fl (ref 78.0–100.0)
MONO ABS: 0.6 10*3/uL (ref 0.1–1.0)
MONOS PCT: 8.8 % (ref 3.0–12.0)
NEUTROS ABS: 3.8 10*3/uL (ref 1.4–7.7)
NEUTROS PCT: 59.4 % (ref 43.0–77.0)
PLATELETS: 277 10*3/uL (ref 150.0–400.0)
RBC: 4.44 Mil/uL (ref 4.22–5.81)
RDW: 13.4 % (ref 11.5–15.5)
WBC: 6.4 10*3/uL (ref 4.0–10.5)

## 2016-03-27 LAB — LIPID PANEL
Cholesterol: 191 mg/dL (ref 0–200)
HDL: 55.8 mg/dL (ref >39.00)
LDL CALC: 124 mg/dL — AB (ref 0–99)
NONHDL: 135.02
Total CHOL/HDL Ratio: 3
Triglycerides: 56 mg/dL (ref 0.0–149.0)
VLDL: 11.2 mg/dL (ref 0.0–40.0)

## 2016-03-27 LAB — TSH: TSH: 3.18 u[IU]/mL (ref 0.35–4.50)

## 2016-03-27 LAB — PSA: PSA: 7.7 ng/mL — AB (ref 0.10–4.00)

## 2016-03-27 MED ORDER — PREDNISONE 20 MG PO TABS: ORAL_TABLET | ORAL | Status: DC

## 2016-03-27 NOTE — Telephone Encounter (Signed)
Pt states he is returning a call from [facility] and request a call back.  Will send message to SN as FYI

## 2016-03-27 NOTE — Telephone Encounter (Signed)
Per SN: 1- needs appt with Urology ASAP Dr Lynne Logan group for PSA 7.70.  2- please call in prednisone 35m #16 >> 1po BID x4 days, then 1 QD x4 days, then 1/2 tab daily x4 days, then 1/2 QOD till gone.  3- place on schedule for f/u 7.19.17 @ 11am with cxr 1st.  Called spoke with patient and discussed the above.  Pt voiced his understanding and wrote the prednisone directions and read them back to me.  Pt is aware of 7.19.17 appt and will arrive early for cxr.  Referral placed Appt scheduled Rx sent Nothing further needed; will sign off

## 2016-04-17 ENCOUNTER — Encounter: Payer: Self-pay | Admitting: Pulmonary Disease

## 2016-04-17 ENCOUNTER — Ambulatory Visit (INDEPENDENT_AMBULATORY_CARE_PROVIDER_SITE_OTHER)
Admission: RE | Admit: 2016-04-17 | Discharge: 2016-04-17 | Disposition: A | Discharge status: Home / Self Care | Payer: Medicare Other | Source: Ambulatory Visit | Attending: Pulmonary Disease | Admitting: Pulmonary Disease

## 2016-04-17 ENCOUNTER — Ambulatory Visit (INDEPENDENT_AMBULATORY_CARE_PROVIDER_SITE_OTHER): Payer: Medicare Other | Admitting: Pulmonary Disease

## 2016-04-17 ENCOUNTER — Ambulatory Visit (INDEPENDENT_AMBULATORY_CARE_PROVIDER_SITE_OTHER): Payer: Medicare Other

## 2016-04-17 VITALS — BP 128/82 | HR 72 | Temp 97.9°F | Ht 74.0 in | Wt 207.0 lb

## 2016-04-17 DIAGNOSIS — R918 Other nonspecific abnormal finding of lung field: Secondary | ICD-10-CM

## 2016-04-17 DIAGNOSIS — I700 Atherosclerosis of aorta: Secondary | ICD-10-CM | POA: Diagnosis not present

## 2016-04-17 DIAGNOSIS — J189 Pneumonia, unspecified organism: Secondary | ICD-10-CM | POA: Diagnosis not present

## 2016-04-17 DIAGNOSIS — N200 Calculus of kidney: Secondary | ICD-10-CM | POA: Diagnosis not present

## 2016-04-17 DIAGNOSIS — R938 Abnormal findings on diagnostic imaging of other specified body structures: Secondary | ICD-10-CM

## 2016-04-17 DIAGNOSIS — R972 Elevated prostate specific antigen [PSA]: Secondary | ICD-10-CM

## 2016-04-17 DIAGNOSIS — R9389 Abnormal findings on diagnostic imaging of other specified body structures: Secondary | ICD-10-CM

## 2016-04-17 NOTE — Patient Instructions (Signed)
Today we updated your med list in our EPIC system...    Continue your current medications the same...  We discussed adding OTC MUCINEX 677m tabs (in the blue box)>>    Take 1-2 tabs twice daily w/ fluids...  We will arrange for a CT Scan of your Chest...    We will contact you w/ the results when available...   Call for any questions..Marland KitchenMarland Kitchen
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
## 2016-04-29 DIAGNOSIS — N320 Bladder-neck obstruction: Secondary | ICD-10-CM | POA: Diagnosis not present

## 2016-04-29 DIAGNOSIS — R972 Elevated prostate specific antigen [PSA]: Secondary | ICD-10-CM | POA: Diagnosis not present

## 2016-05-31 DIAGNOSIS — R972 Elevated prostate specific antigen [PSA]: Secondary | ICD-10-CM | POA: Diagnosis not present

## 2016-05-31 HISTORY — PX: PROSTATE BIOPSY: SHX241

## 2016-06-07 DIAGNOSIS — C61 Malignant neoplasm of prostate: Secondary | ICD-10-CM | POA: Diagnosis not present

## 2016-06-07 DIAGNOSIS — N320 Bladder-neck obstruction: Secondary | ICD-10-CM | POA: Diagnosis not present

## 2016-06-13 DIAGNOSIS — Z23 Encounter for immunization: Secondary | ICD-10-CM | POA: Diagnosis not present

## 2016-07-10 ENCOUNTER — Ambulatory Visit
Admission: RE | Admit: 2016-07-10 | Discharge: 2016-07-10 | Disposition: A | Discharge status: Home / Self Care | Payer: Medicare Other | Source: Ambulatory Visit | Attending: Radiation Oncology | Admitting: Radiation Oncology

## 2016-07-10 ENCOUNTER — Encounter: Payer: Self-pay | Admitting: Radiation Oncology

## 2016-07-10 DIAGNOSIS — Z79899 Other long term (current) drug therapy: Secondary | ICD-10-CM | POA: Diagnosis not present

## 2016-07-10 DIAGNOSIS — C61 Malignant neoplasm of prostate: Secondary | ICD-10-CM

## 2016-07-10 DIAGNOSIS — Z85828 Personal history of other malignant neoplasm of skin: Secondary | ICD-10-CM | POA: Insufficient documentation

## 2016-07-10 DIAGNOSIS — Z7982 Long term (current) use of aspirin: Secondary | ICD-10-CM | POA: Insufficient documentation

## 2016-07-10 DIAGNOSIS — I10 Essential (primary) hypertension: Secondary | ICD-10-CM | POA: Insufficient documentation

## 2016-07-10 DIAGNOSIS — N529 Male erectile dysfunction, unspecified: Secondary | ICD-10-CM | POA: Insufficient documentation

## 2016-07-10 DIAGNOSIS — E7800 Pure hypercholesterolemia, unspecified: Secondary | ICD-10-CM | POA: Diagnosis not present

## 2016-07-10 HISTORY — DX: Malignant neoplasm of prostate: C61
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
## 2016-07-10 NOTE — Progress Notes (Signed)
GU Location of Tumor / Histology: prostatic adenocarcinoma  If Prostate Cancer, Gleason Score is (3 + 4) and PSA is (5.96) on 04/15/16. Prostate volume: 34 grams  Biopsies of prostate (if applicable) revealed:    Past/Anticipated interventions by urology, if any: prostate biopsy and referral to radiation oncology for discussion about brachytherapy  Past/Anticipated interventions by medical oncology, if any: no  Weight changes, if any: No  Bowel/Bladder complaints, if any:  IPSS 5 with incomplete emptying, intermittency, weak stream and nocturia x 2. Denies dysuria, hematuria, incontinence or leakage. Mild ED reported.   Nausea/Vomiting, if any: no  Pain issues, if any:  no  SAFETY ISSUES:  Prior radiation? no  Pacemaker/ICD? no  Possible current pregnancy? no  Is the patient on methotrexate? no  Current Complaints / other details:  75 year old male. 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 Denies any family history of cancer.   BP (!) 150/83 (BP Location: Left Arm, Patient Position: Sitting, Cuff Size: Normal)   Pulse 63   Resp 18   Wt 213 lb 12.8 oz (97 kg)   SpO2 100%   BMI 27.45 kg/m

## 2016-07-10 NOTE — Progress Notes (Signed)
See progress note under physician encounter. 

## 2016-07-11 ENCOUNTER — Telehealth: Payer: Self-pay | Admitting: *Deleted

## 2016-07-11 NOTE — Telephone Encounter (Signed)
Called patient to inform of pre-seed appt. On 07-26-16 and his implant on 09-13-16, spoke with patient and he is aware of these appts.

## 2016-07-11 NOTE — Telephone Encounter (Signed)
CALLED PATIENT TO INFORM OF PRE-SEED PLANNING CT AND IMPLANT, NO ANSWER WILL CALL LATER

## 2016-07-15 ENCOUNTER — Other Ambulatory Visit: Payer: Self-pay | Admitting: Urology

## 2016-07-19 NOTE — Progress Notes (Signed)
  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     ICD-9-CM ICD-10-CM   1. Malignant neoplasm of prostate ([place]) [place]:  The patient presented today for evaluation for possible prostate seed implant. He was brought to the radiation planning suite and placed supine on the CT couch. A 3-dimensional image study set was obtained in upload to the planning computer. There, on each axial slice, I contoured the prostate gland. Then, using three-dimensional radiation planning tools I reconstructed the prostate in view of the structures from the transperineal needle pathway to assess for possible pubic arch interference. In doing so, I did not appreciate any pubic arch interference. Also, the patient's prostate volume was estimated based on the drawn structure. The volume was 31 cc.  Given the pubic arch appearance and prostate volume, patient remains a good candidate to proceed with prostate seed implant. Today, he freely provided informed written consent to proceed.    PLAN: The patient will undergo prostate seed implant.   ________________________________  Sheral Apley. Tammi Klippel, M.D.

## 2016-07-25 ENCOUNTER — Telehealth: Payer: Self-pay | Admitting: *Deleted

## 2016-07-25 NOTE — Telephone Encounter (Signed)
CALLED PATIENT TO REMIND OF APPTS. FOR 07-26-16, SPOKE WITH PATIENT AND HE IS AWARE OF THESE APPTS.

## 2016-07-26 ENCOUNTER — Ambulatory Visit (HOSPITAL_BASED_OUTPATIENT_CLINIC_OR_DEPARTMENT_OTHER)
Admission: RE | Admit: 2016-07-26 | Discharge: 2016-07-26 | Disposition: A | Discharge status: Home / Self Care | Payer: Medicare Other | Source: Ambulatory Visit | Attending: Urology | Admitting: Urology

## 2016-07-26 ENCOUNTER — Ambulatory Visit
Admission: RE | Admit: 2016-07-26 | Discharge: 2016-07-26 | Disposition: A | Discharge status: Home / Self Care | Payer: Medicare Other | Source: Ambulatory Visit | Attending: Radiation Oncology | Admitting: Radiation Oncology

## 2016-07-26 ENCOUNTER — Encounter (HOSPITAL_BASED_OUTPATIENT_CLINIC_OR_DEPARTMENT_OTHER)
Admission: RE | Admit: 2016-07-26 | Discharge: 2016-07-26 | Disposition: A | Discharge status: Home / Self Care | Payer: Medicare Other | Source: Ambulatory Visit | Attending: Urology | Admitting: Urology

## 2016-07-26 DIAGNOSIS — C61 Malignant neoplasm of prostate: Secondary | ICD-10-CM

## 2016-07-26 DIAGNOSIS — Z01818 Encounter for other preprocedural examination: Secondary | ICD-10-CM | POA: Insufficient documentation

## 2016-08-13 ENCOUNTER — Other Ambulatory Visit (INDEPENDENT_AMBULATORY_CARE_PROVIDER_SITE_OTHER): Payer: Medicare Other

## 2016-08-13 ENCOUNTER — Ambulatory Visit (INDEPENDENT_AMBULATORY_CARE_PROVIDER_SITE_OTHER): Payer: Medicare Other | Admitting: Pulmonary Disease

## 2016-08-13 ENCOUNTER — Encounter: Payer: Self-pay | Admitting: Pulmonary Disease

## 2016-08-13 ENCOUNTER — Ambulatory Visit (INDEPENDENT_AMBULATORY_CARE_PROVIDER_SITE_OTHER)
Admission: RE | Admit: 2016-08-13 | Discharge: 2016-08-13 | Disposition: A | Discharge status: Home / Self Care | Payer: Medicare Other | Source: Ambulatory Visit | Attending: Pulmonary Disease | Admitting: Pulmonary Disease

## 2016-08-13 VITALS — BP 140/82 | HR 70 | Temp 97.0°F | Ht 72.0 in | Wt 214.2 lb

## 2016-08-13 DIAGNOSIS — R9389 Abnormal findings on diagnostic imaging of other specified body structures: Secondary | ICD-10-CM

## 2016-08-13 DIAGNOSIS — I10 Essential (primary) hypertension: Secondary | ICD-10-CM | POA: Diagnosis not present

## 2016-08-13 DIAGNOSIS — C61 Malignant neoplasm of prostate: Secondary | ICD-10-CM

## 2016-08-13 DIAGNOSIS — R938 Abnormal findings on diagnostic imaging of other specified body structures: Secondary | ICD-10-CM

## 2016-08-13 LAB — BASIC METABOLIC PANEL
BUN: 20 mg/dL (ref 6–23)
CO2: 28 mEq/L (ref 19–32)
CREATININE: 0.78 mg/dL (ref 0.40–1.50)
Calcium: 9.1 mg/dL (ref 8.4–10.5)
Chloride: 105 mEq/L (ref 96–112)
GFR: 103.61 mL/min (ref >60.00)
Glucose, Bld: 91 mg/dL (ref 70–99)
POTASSIUM: 4.2 meq/L (ref 3.5–5.1)
Sodium: 139 mEq/L (ref 135–145)

## 2016-08-13 NOTE — Patient Instructions (Signed)
Today we updated your med list in our EPIC system...    Continue your current medications the same...  Today we repeated your CXR...  We will arrange for a follow up CT Chest- this time with contrast to evaluate the right hilar area...    We will contact you w/ the results when available...   Call for any questions...  We will determine the timing of your follow up visit after we see the scan.Marland KitchenMarland Kitchen

## 2016-08-14 ENCOUNTER — Encounter: Payer: Self-pay | Admitting: Pulmonary Disease
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
## 2016-08-15 ENCOUNTER — Ambulatory Visit (HOSPITAL_BASED_OUTPATIENT_CLINIC_OR_DEPARTMENT_OTHER)
Admission: RE | Admit: 2016-08-15 | Discharge: 2016-08-15 | Disposition: A | Discharge status: Home / Self Care | Payer: Medicare Other | Source: Ambulatory Visit | Attending: Pulmonary Disease | Admitting: Pulmonary Disease

## 2016-08-15 ENCOUNTER — Encounter (HOSPITAL_BASED_OUTPATIENT_CLINIC_OR_DEPARTMENT_OTHER): Payer: Self-pay

## 2016-08-15 DIAGNOSIS — J9811 Atelectasis: Secondary | ICD-10-CM | POA: Insufficient documentation

## 2016-08-15 DIAGNOSIS — R911 Solitary pulmonary nodule: Secondary | ICD-10-CM | POA: Diagnosis not present

## 2016-08-15 DIAGNOSIS — R9389 Abnormal findings on diagnostic imaging of other specified body structures: Secondary | ICD-10-CM

## 2016-08-15 DIAGNOSIS — R938 Abnormal findings on diagnostic imaging of other specified body structures: Secondary | ICD-10-CM | POA: Diagnosis present

## 2016-08-15 MED ORDER — IOPAMIDOL (ISOVUE-300) INJECTION 61%
80.0000 mL | Freq: Once | INTRAVENOUS | Status: AC | PRN
  Administered 2016-08-15: 80 mL via INTRAVENOUS

## 2016-09-04 ENCOUNTER — Ambulatory Visit: Admission: RE | Admit: 2016-09-04 | Payer: Medicare Other | Source: Ambulatory Visit

## 2016-09-06 DIAGNOSIS — I10 Essential (primary) hypertension: Secondary | ICD-10-CM | POA: Diagnosis not present

## 2016-09-06 DIAGNOSIS — C61 Malignant neoplasm of prostate: Secondary | ICD-10-CM | POA: Diagnosis present

## 2016-09-06 DIAGNOSIS — Z79899 Other long term (current) drug therapy: Secondary | ICD-10-CM | POA: Diagnosis not present

## 2016-09-06 DIAGNOSIS — Z7982 Long term (current) use of aspirin: Secondary | ICD-10-CM | POA: Diagnosis not present

## 2016-09-06 DIAGNOSIS — Z5181 Encounter for therapeutic drug level monitoring: Secondary | ICD-10-CM | POA: Diagnosis not present

## 2016-09-06 LAB — CBC
HCT: 42.7 % (ref 39.0–52.0)
Hemoglobin: 14.6 g/dL (ref 13.0–17.0)
MCH: 31.5 pg (ref 26.0–34.0)
MCHC: 34.2 g/dL (ref 30.0–36.0)
MCV: 92 fL (ref 78.0–100.0)
PLATELETS: 208 10*3/uL (ref 150–400)
RBC: 4.64 MIL/uL (ref 4.22–5.81)
RDW: 12.9 % (ref 11.5–15.5)
WBC: 5.3 10*3/uL (ref 4.0–10.5)

## 2016-09-06 LAB — COMPREHENSIVE METABOLIC PANEL
ALBUMIN: 4 g/dL (ref 3.5–5.0)
ALT: 19 U/L (ref 17–63)
ANION GAP: 5 (ref 5–15)
AST: 20 U/L (ref 15–41)
Alkaline Phosphatase: 60 U/L (ref 38–126)
BUN: 14 mg/dL (ref 6–20)
CO2: 28 mmol/L (ref 22–32)
Calcium: 8.8 mg/dL — ABNORMAL LOW (ref 8.9–10.3)
Chloride: 106 mmol/L (ref 101–111)
Creatinine, Ser: 0.79 mg/dL (ref 0.61–1.24)
GFR calc Af Amer: >60 mL/min (ref >60)
GFR calc non Af Amer: >60 mL/min (ref >60)
GLUCOSE: 92 mg/dL (ref 65–99)
POTASSIUM: 4.2 mmol/L (ref 3.5–5.1)
SODIUM: 139 mmol/L (ref 135–145)
TOTAL PROTEIN: 6.4 g/dL — AB (ref 6.5–8.1)
Total Bilirubin: 0.7 mg/dL (ref 0.3–1.2)

## 2016-09-06 LAB — PROTIME-INR
INR: 0.96
Prothrombin Time: 12.8 seconds (ref 11.4–15.2)

## 2016-09-06 LAB — APTT: APTT: 28 s (ref 24–36)

## 2016-09-12 ENCOUNTER — Encounter: Payer: Self-pay | Admitting: Medical Oncology

## 2016-09-12 ENCOUNTER — Telehealth: Payer: Self-pay | Admitting: *Deleted

## 2016-09-12 ENCOUNTER — Encounter (HOSPITAL_BASED_OUTPATIENT_CLINIC_OR_DEPARTMENT_OTHER): Payer: Self-pay | Admitting: *Deleted

## 2016-09-12 NOTE — Progress Notes (Signed)
NPO AFTER MN.  ARRIVE AT 0930.  CURRENT LAB RESULTS, CXR, AND EKG.  WILL DO AM FLEET ENEMA DOS.

## 2016-09-12 NOTE — Progress Notes (Signed)
Nicholas Garza for brachytherapy 09/13/16. He confirmed his appointment and we reviewed the procedure. I asked him to call me back with any questions or concerns.

## 2016-09-12 NOTE — Progress Notes (Signed)
  Radiation Oncology         [phone] ________________________________  Name: Nicholas Garza MRN: [id number]  Date: 09/12/2016  DOB: 07/11/1941       Prostate Seed Implant  BL:TJQZE,SPQZR M, MD  No ref. 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  RADIATION DOSE: 145 Gy, definitive therapy.  TECHNIQUE: AZARIUS LAMBSON was brought to the operating room with the urologist. He was placed in the dorsolithotomy position. He was catheterized and a rectal tube was inserted. The perineum was shaved, prepped and draped. The ultrasound probe was then introduced into the rectum to see the prostate gland.  TREATMENT DEVICE: A needle grid was attached to the ultrasound probe stand and anchor needles were placed.  3D PLANNING: The prostate was imaged in 3D using a sagittal sweep of the prostate probe. These images were transferred to the planning computer. There, the prostate, urethra and rectum were defined on each axial reconstructed image. Then, the software created an optimized 3D plan and a few seed positions were adjusted. The quality of the plan was reviewed using [facility] information for the target and the following two organs at risk:  Urethra and Rectum.  Then the accepted plan was uploaded to the seed Selectron afterloading unit.  PROSTATE VOLUME STUDY:  Using transrectal ultrasound the volume of the prostate was verified to be 36.2 cc.  SPECIAL TREATMENT PROCEDURE/SUPERVISION AND HANDLING: The Nucletron FIRST system was used to place the needles under sagittal guidance. A total of 21 needles were used to deposit 72 seeds in the prostate gland. The individual seed activity was 0.389 mCi.  COMPLEX SIMULATION: At the end of the procedure, an anterior radiograph of the pelvis was obtained to document seed positioning  and count. Cystoscopy was performed to check the urethra and bladder.  MICRODOSIMETRY: At the end of the procedure, the patient was emitting 0.121 mR/hr at 1 meter. Accordingly, he was considered safe for hospital discharge.  PLAN: The patient will return to the radiation oncology clinic for post implant CT dosimetry in three weeks.   ________________________________  Sheral Apley Tammi Klippel, M.D.

## 2016-09-12 NOTE — Telephone Encounter (Signed)
Called patient to remind of implant for 09-13-16, spoke with patient and he is aware of this implant.

## 2016-09-13 ENCOUNTER — Ambulatory Visit (HOSPITAL_BASED_OUTPATIENT_CLINIC_OR_DEPARTMENT_OTHER)
Admission: RE | Admit: 2016-09-13 | Discharge: 2016-09-13 | Disposition: A | Discharge status: Home / Self Care | Payer: Medicare Other | Source: Ambulatory Visit | Attending: Urology | Admitting: Urology

## 2016-09-13 ENCOUNTER — Encounter (HOSPITAL_BASED_OUTPATIENT_CLINIC_OR_DEPARTMENT_OTHER): Payer: Self-pay | Admitting: *Deleted

## 2016-09-13 ENCOUNTER — Ambulatory Visit (HOSPITAL_BASED_OUTPATIENT_CLINIC_OR_DEPARTMENT_OTHER): Payer: Medicare Other | Admitting: Anesthesiology

## 2016-09-13 ENCOUNTER — Encounter (HOSPITAL_BASED_OUTPATIENT_CLINIC_OR_DEPARTMENT_OTHER)
Admission: RE | Disposition: A | Discharge status: Home / Self Care | Payer: Self-pay | Source: Ambulatory Visit | Attending: Urology

## 2016-09-13 ENCOUNTER — Ambulatory Visit (HOSPITAL_COMMUNITY): Payer: Medicare Other

## 2016-09-13 DIAGNOSIS — I10 Essential (primary) hypertension: Secondary | ICD-10-CM | POA: Diagnosis not present

## 2016-09-13 DIAGNOSIS — Z7982 Long term (current) use of aspirin: Secondary | ICD-10-CM | POA: Diagnosis not present

## 2016-09-13 DIAGNOSIS — C61 Malignant neoplasm of prostate: Secondary | ICD-10-CM | POA: Diagnosis not present

## 2016-09-13 DIAGNOSIS — Z5181 Encounter for therapeutic drug level monitoring: Secondary | ICD-10-CM | POA: Insufficient documentation

## 2016-09-13 DIAGNOSIS — Z79899 Other long term (current) drug therapy: Secondary | ICD-10-CM | POA: Insufficient documentation

## 2016-09-13 HISTORY — DX: Plantar fascial fibromatosis: M72.2

## 2016-09-13 HISTORY — DX: Personal history of other malignant neoplasm of skin: Z85.828

## 2016-09-13 HISTORY — PX: RADIOACTIVE SEED IMPLANT: SHX5150

## 2016-09-13 HISTORY — DX: Hyperlipidemia, unspecified: E78.5

## 2016-09-13 HISTORY — DX: Elevated blood-pressure reading, without diagnosis of hypertension: R03.0

## 2016-09-13 HISTORY — DX: Solitary pulmonary nodule: R91.1

## 2016-09-13 HISTORY — DX: Personal history of other malignant neoplasm of skin: Z98.890

## 2016-09-13 HISTORY — DX: Male erectile dysfunction, unspecified: N52.9

## 2016-09-13 HISTORY — DX: Personal history of other (healed) physical injury and trauma: Z87.828

## 2016-09-13 HISTORY — DX: Calculus of kidney: N20.0

## 2016-09-13 SURGERY — INSERTION, RADIATION SOURCE, PROSTATE
Anesthesia: General | Site: Prostate

## 2016-09-13 MED ORDER — PROPOFOL 10 MG/ML IV BOLUS
INTRAVENOUS | Status: AC
  Filled 2016-09-13: qty 40

## 2016-09-13 MED ORDER — DEXAMETHASONE SODIUM PHOSPHATE 10 MG/ML IJ SOLN
INTRAMUSCULAR | Status: AC
  Filled 2016-09-13: qty 1

## 2016-09-13 MED ORDER — GENTAMICIN SULFATE 40 MG/ML IJ SOLN
5.0000 mg/kg | INTRAVENOUS | Status: DC
  Filled 2016-09-13 (×3): qty 10.5

## 2016-09-13 MED ORDER — LACTATED RINGERS IV SOLN
INTRAVENOUS | Status: DC
  Administered 2016-09-13 (×2): via INTRAVENOUS
  Filled 2016-09-13: qty 1000

## 2016-09-13 MED ORDER — DEXAMETHASONE SODIUM PHOSPHATE 4 MG/ML IJ SOLN
INTRAMUSCULAR | Status: DC | PRN
Start: 2016-09-13 — End: 2016-09-13
  Administered 2016-09-13: 10 mg via INTRAVENOUS

## 2016-09-13 MED ORDER — OXYCODONE HCL 5 MG PO TABS
5.0000 mg | ORAL_TABLET | Freq: Once | ORAL | Status: DC | PRN
  Filled 2016-09-13: qty 1

## 2016-09-13 MED ORDER — HYDROMORPHONE HCL 1 MG/ML IJ SOLN
0.2500 mg | INTRAMUSCULAR | Status: DC | PRN
  Filled 2016-09-13: qty 0.5

## 2016-09-13 MED ORDER — OXYCODONE HCL 5 MG/5ML PO SOLN
5.0000 mg | Freq: Once | ORAL | Status: DC | PRN
  Filled 2016-09-13: qty 5

## 2016-09-13 MED ORDER — KETOROLAC TROMETHAMINE 30 MG/ML IJ SOLN
15.0000 mg | Freq: Once | INTRAMUSCULAR | Status: DC | PRN
  Filled 2016-09-13: qty 1

## 2016-09-13 MED ORDER — HYDROCODONE-ACETAMINOPHEN 5-325 MG PO TABS: 1.0000 | ORAL_TABLET | Freq: Four times a day (QID) | ORAL | 0 refills | Status: DC | PRN

## 2016-09-13 MED ORDER — GLYCOPYRROLATE 0.2 MG/ML IV SOSY
PREFILLED_SYRINGE | INTRAVENOUS | Status: AC
  Filled 2016-09-13: qty 3

## 2016-09-13 MED ORDER — DEXTROSE 5 % IV SOLN
5.0000 mg/kg | INTRAVENOUS | Status: AC
  Administered 2016-09-13: 420 mg via INTRAVENOUS
  Filled 2016-09-13 (×2): qty 10.5

## 2016-09-13 MED ORDER — BELLADONNA ALKALOIDS-OPIUM 16.2-60 MG RE SUPP
RECTAL | Status: DC | PRN
  Administered 2016-09-13: 1 via RECTAL

## 2016-09-13 MED ORDER — DEXTROSE 5 % IV SOLN
5.0000 mg/kg | Freq: Once | INTRAVENOUS | Status: DC
Start: 2016-09-13 — End: 2016-09-13
  Filled 2016-09-13: qty 12.25

## 2016-09-13 MED ORDER — LIDOCAINE 2% (20 MG/ML) 5 ML SYRINGE
INTRAMUSCULAR | Status: DC | PRN
  Administered 2016-09-13: 60 mg via INTRAVENOUS

## 2016-09-13 MED ORDER — LIDOCAINE 2% (20 MG/ML) 5 ML SYRINGE
INTRAMUSCULAR | Status: AC
  Filled 2016-09-13: qty 5

## 2016-09-13 MED ORDER — FENTANYL CITRATE (PF) 100 MCG/2ML IJ SOLN
INTRAMUSCULAR | Status: DC | PRN
  Administered 2016-09-13: 25 ug via INTRAVENOUS
  Administered 2016-09-13: 50 ug via INTRAVENOUS
  Administered 2016-09-13: 25 ug via INTRAVENOUS

## 2016-09-13 MED ORDER — STERILE WATER FOR IRRIGATION IR SOLN
Status: DC | PRN
  Administered 2016-09-13: 3 mL

## 2016-09-13 MED ORDER — ONDANSETRON HCL 4 MG/2ML IJ SOLN
INTRAMUSCULAR | Status: AC
  Filled 2016-09-13: qty 2

## 2016-09-13 MED ORDER — PROPOFOL 10 MG/ML IV BOLUS
INTRAVENOUS | Status: DC | PRN
  Administered 2016-09-13: 200 mg via INTRAVENOUS

## 2016-09-13 MED ORDER — ONDANSETRON HCL 4 MG/2ML IJ SOLN
INTRAMUSCULAR | Status: DC | PRN
  Administered 2016-09-13: 4 mg via INTRAVENOUS

## 2016-09-13 MED ORDER — MIDAZOLAM HCL 2 MG/2ML IJ SOLN
INTRAMUSCULAR | Status: AC
  Filled 2016-09-13: qty 2

## 2016-09-13 MED ORDER — FLEET ENEMA 7-19 GM/118ML RE ENEM
1.0000 | ENEMA | Freq: Once | RECTAL | Status: DC
  Filled 2016-09-13: qty 1

## 2016-09-13 MED ORDER — FENTANYL CITRATE (PF) 100 MCG/2ML IJ SOLN
INTRAMUSCULAR | Status: AC
  Filled 2016-09-13: qty 2

## 2016-09-13 MED ORDER — LIDOCAINE HCL 2 % EX GEL
CUTANEOUS | Status: AC
  Filled 2016-09-13: qty 5

## 2016-09-13 MED ORDER — IOHEXOL 300 MG/ML  SOLN
INTRAMUSCULAR | Status: DC | PRN
  Administered 2016-09-13: 7 mL

## 2016-09-13 MED ORDER — BELLADONNA ALKALOIDS-OPIUM 16.2-60 MG RE SUPP
RECTAL | Status: AC
  Filled 2016-09-13: qty 1

## 2016-09-13 MED ORDER — LIDOCAINE HCL 2 % EX GEL
CUTANEOUS | Status: DC | PRN
  Administered 2016-09-13: 1 via URETHRAL

## 2016-09-13 MED ORDER — SULFAMETHOXAZOLE-TRIMETHOPRIM 800-160 MG PO TABS: 1.0000 | ORAL_TABLET | Freq: Two times a day (BID) | ORAL | 0 refills | Status: AC

## 2016-09-13 MED ORDER — MIDAZOLAM HCL 5 MG/5ML IJ SOLN
INTRAMUSCULAR | Status: DC | PRN
  Administered 2016-09-13: 2 mg via INTRAVENOUS

## 2016-09-13 MED ORDER — PHENYLEPHRINE 40 MCG/ML (10ML) SYRINGE FOR IV PUSH (FOR BLOOD PRESSURE SUPPORT)
PREFILLED_SYRINGE | INTRAVENOUS | Status: AC
  Filled 2016-09-13: qty 10

## 2016-09-13 MED ORDER — PROMETHAZINE HCL 25 MG/ML IJ SOLN
6.2500 mg | INTRAMUSCULAR | Status: DC | PRN
  Filled 2016-09-13: qty 1

## 2016-09-13 MED ORDER — SODIUM CHLORIDE 0.9 % IV SOLN
INTRAVENOUS | Status: DC | PRN
  Administered 2016-09-13: 1000 mL

## 2016-09-13 SURGICAL SUPPLY — 26 items
BAG URINE DRAINAGE (UROLOGICAL SUPPLIES) ×3 IMPLANT
BLADE CLIPPER SURG (BLADE) ×3 IMPLANT
CATH FOLEY 2WAY SLVR  5CC 16FR (CATHETERS) ×4
CATH FOLEY 2WAY SLVR 5CC 16FR (CATHETERS) ×2 IMPLANT
CATH ROBINSON RED A/P 20FR (CATHETERS) ×3 IMPLANT
CLOTH BEACON ORANGE TIMEOUT ST (SAFETY) ×3 IMPLANT
COVER BACK TABLE 60X90IN (DRAPES) ×3 IMPLANT
COVER MAYO STAND STRL (DRAPES) ×3 IMPLANT
DRSG TEGADERM 4X4.75 (GAUZE/BANDAGES/DRESSINGS) ×3 IMPLANT
DRSG TEGADERM 8X12 (GAUZE/BANDAGES/DRESSINGS) ×3 IMPLANT
GLOVE BIO SURGEON STRL SZ7.5 (GLOVE) ×6 IMPLANT
GLOVE ECLIPSE 8.0 STRL XLNG CF (GLOVE) ×12 IMPLANT
GOWN STRL REUS W/ TWL XL LVL3 (GOWN DISPOSABLE) ×3 IMPLANT
GOWN STRL REUS W/TWL XL LVL3 (GOWN DISPOSABLE) ×6
HOLDER FOLEY CATH W/STRAP (MISCELLANEOUS) ×3 IMPLANT
IV NS 1000ML (IV SOLUTION) ×2
IV NS 1000ML BAXH (IV SOLUTION) ×1 IMPLANT
KIT ROOM TURNOVER WOR (KITS) ×3 IMPLANT
PACK CYSTO (CUSTOM PROCEDURE TRAY) ×3 IMPLANT
SPONGE GAUZE 4X4 12PLY STER LF (GAUZE/BANDAGES/DRESSINGS) ×3 IMPLANT
SYRINGE 10CC LL (SYRINGE) ×3 IMPLANT
TUBE CONNECTING 12'X1/4 (SUCTIONS)
TUBE CONNECTING 12X1/4 (SUCTIONS) IMPLANT
UNDERPAD 30X30 INCONTINENT (UNDERPADS AND DIAPERS) ×6 IMPLANT
WATER STERILE IRR 500ML POUR (IV SOLUTION) ×3 IMPLANT
seed ×216 IMPLANT

## 2016-09-13 NOTE — Discharge Instructions (Addendum)
Radioactive Seed Implant Home Care Instructions   Activity:    Rest for the remainder of the day.  Do not drive or operate equipment today.  You may resume normal activities in a few days as instructed by your physician, without risk of harmful radiation exposure to those around you, provided you follow the time and distance precautions on the Radiation Oncology Instruction Sheet.   Meals: Drink plenty of lipuids and eat light foods, such as gelatin or soup this evening .  You may return to normal meal plan tomorrow.  Return To Work: You may return to work as instructed by Naval architect.  Special Instruction:   If any seeds are found, use tweezers to pick up seeds and place in a glass container of any kind and bring to your physician's office.  Call your physician if any of these symptoms occur:   Persistent or heavy bleeding  Urine stream diminishes or stops completely after catheter is removed  Fever equal to or greater than 101 degrees F  Cloudy urine with a strong foul odor  Severe pain  You may feel some burning pain and/or hesitancy when you urinate after the catheter is removed.  These symptoms may increase over the next few weeks, but should diminish within forur to six weeks.  Applying moist heat to the lower abdomen or a hot tub bath may help relieve the pain.  If the discomfort becomes severe, please call your physician for additional medications.  Follow-up (Date of Return Visit to Physician):  Patient:_______________________________   @DATE @  Nurse:________________________________ @DATE @  Post Anesthesia Home Care Instructions  Activity: Get plenty of rest for the remainder of the day. A responsible adult should stay with you for 24 hours following the procedure.  For the next 24 hours, DO NOT: -Drive a car -Paediatric nurse -Drink alcoholic beverages -Take any medication unless instructed by your physician -Make any legal decisions or sign important  papers.  Meals: Start with liquid foods such as gelatin or soup. Progress to regular foods as tolerated. Avoid greasy, spicy, heavy foods. If nausea and/or vomiting occur, drink only clear liquids until the nausea and/or vomiting subsides. Call your physician if vomiting continues.  Special Instructions/Symptoms: Your throat may feel dry or sore from the anesthesia or the breathing tube placed in your throat during surgery. If this causes discomfort, gargle with warm salt water. The discomfort should disappear within 24 hours.  If you had a scopolamine patch placed behind your ear for the management of post- operative nausea and/or vomiting:  1. The medication in the patch is effective for 72 hours, after which it should be removed.  Wrap patch in a tissue and discard in the trash. Wash hands thoroughly with soap and water. 2. You may remove the patch earlier than 72 hours if you experience unpleasant side effects which may include dry mouth, dizziness or visual disturbances. 3. Avoid touching the patch. Wash your hands with soap and water after contact with the patch.   DISCHARGE INSTRUCTIONS FOR PROSTATE SEED IMPLANTATION  Removal of catheter Remove the foley catheter after 24 hours ( day after the procedure).can be done easily by cutting the side port of the catheter, whichallow the balloon to deflate.  You will see 1-2 teaspoons of clear water as the balloon deflates and then the catheter can be slid out without difficulty.        Cut here  Antibiotics You may be given a prescription for an antibiotic to take when you  arrive home. If so, be sure to take every tablet in the bottle, even if you are feeling better before the prescription is finished. If you begin itching, notice a rash or start to swell on your trunk, arms, legs and/or throat, immediately stop taking the antibiotic and call your Urologist. Diet Resume your usual diet when you return home. To keep your bowels moving easily  and softly, drink prune, apple and cranberry juice at room temperature. You may also take a stool softener, such as Colace, which is available without prescription at local pharmacies. Daily activities ? No driving or heavy lifting for at least two days after the implant. ? No bike riding, horseback riding or riding lawn mowers for the first month after the implant. ? Any strenuous physical activity should be approved by your doctor before you resume it. Sexual relations You may resume sexual relations two weeks after the procedure. A condom should be used for the first two weeks. Your semen may be dark brown or black; this is normal and is related bleeding that may have occurred during the implant. Postoperative swelling Expect swelling and bruising of the scrotum and perineum (the area between the scrotum and anus). Both the swelling and the bruising should resolve in l or 2 weeks. Ice packs and over- the-counter medications such as Tylenol, Advil or Aleve may lessen your discomfort. Postoperative urination Most men experience burning on urination and/or urinary frequency. If this becomes bothersome, contact your Urologist.  Medication can be prescribed to relieve these problems.  It is normal to have some blood in your urine for a few days after the implant. Special instructions related to the seeds It is unlikely that you will pass an Iodine-125 seed in your urine. The seeds are silver in color and are about as large as a grain of rice. If you pass a seed, do not handle it with your fingers. Use a spoon to place it in an envelope or jar in place this in base occluded area such as the garage or basement for return to the radiation clinic at your convenience.  Contact your doctor for ? Temperature greater than 101 F ? Increasing pain ? Inability to urinate Follow-up  You should have follow up with your urologist and radiation oncologist about 3 weeks after the procedure. General information  regarding Iodine seeds ? Iodine-125 is a low energy radioactive material. It is not deeply penetrating and loses energy at short distances. Your prostate will absorb the radiation. Objects that are touched or used by the patient do not become radioactive. ? Body wastes (urine and stool) or body fluids (saliva, tears, semen or blood) are not radioactive. ? The Nuclear Regulatory Commission [facility]) has determined that no radiation precautions are needed for patients undergoing Iodine-125 seed implantation. The [facility] states that such patients do not present a risk to the people around them, including young children and pregnant women. However, in keeping with the general principle that radiation exposure should be kept as low reasonably possible, we suggest the following: ? Children and pets should not sit on the patient's lap for the first two (2) weeks after the implant. ? Pregnant (or possibly pregnant) women should avoid prolonged, close contact with the patient for the first two (2) weeks after the implant. ? A distance of three (3) feet is acceptable. ? At a distance of three (3) feet, there is no limit to the length of time anyone can be with the patient. Resume aspirin in 3  days

## 2016-09-13 NOTE — H&P (Signed)
Chief complaint/HPI:   Nicholas Garza presents today for prostate seed implantation for treatment of his intermediate risk adenocarcinoma of prostate.  He has undergone consultation with myself and Dr. Ledon Snare as elected to proceed with seed implantation.   Prostate ultrasound performed September 2017.  PSA prior to biopsy was 7.7. This was repeated and found to be 6.0.Prior to that his PSA had been in the 3-4 range from 2012-2015   ultrasound revealed a 34 g prostate. There were no worrisome ultrasound findings.2 biopsies were positive for adenocarcinoma. He had a positive biopsy at the left apex for Gleason 3+4 = 7 cancer involving 50% of that core. There was a second positive biopsy at the right apex for Gleason 3+3 equal 6 cancer involving 5% of that core.   He is best classified as having intermediate risk clinical stage TIc disease. He has had no obvious complications or problems from the biopsy.   No additional imaging studies were required.    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  Has not drank since 03/30/1976.  Drinks 4+ caffeinated drinks per day. 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  REVIEW OF SYSTEMS:    GU Review Male:   Patient denies frequent urination, hard to postpone urination, burning/ pain with urination, get up at night to urinate, leakage of urine, stream starts and  stops, trouble starting your stream, have to strain to urinate , erection problems, and penile pain.  Gastrointestinal (Upper):   Patient denies nausea, vomiting, and indigestion/ heartburn.  Gastrointestinal (Lower):   Patient denies diarrhea and constipation.  Constitutional:   Patient denies fever, night sweats, weight loss, and fatigue.  Skin:   Patient denies skin rash/ lesion and itching.  Eyes:   Patient denies blurred vision and double vision.  Ears/ Nose/ Throat:   Patient denies sore throat and sinus problems.  Hematologic/Lymphatic:   Patient denies swollen glands and easy bruising.  Cardiovascular:   Patient denies leg swelling and chest pains.  Respiratory:   Patient denies cough and shortness of breath.  Endocrine:   Patient denies excessive thirst.  Musculoskeletal:   Patient denies back pain and joint pain.  Neurological:   Patient denies headaches and dizziness.  Psychologic:   Patient denies depression and anxiety  GU PHYSICAL EXAMINATION:    Anus and Perineum: No hemorrhoids. No anal stenosis. No rectal fissure, no anal fissure. No edema, no dimple, no perineal tenderness, no anal tenderness.  Scrotum: No lesions. No edema. No cysts. No warts.  Epididymides: Right: no spermatocele, no masses, no cysts, no tenderness, no induration, no enlargement. Left: no spermatocele, no masses, no cysts, no tenderness, no induration, no enlargement.  Testes: No tenderness, no swelling, no enlargement left testes. No tenderness, no swelling, no enlargement right testes. Normal location left testes. Normal location right testes. No mass, no cyst, no varicocele, no hydrocele left testes. No mass,  no cyst, no varicocele, no hydrocele right testes.  Urethral Meatus: Normal size. No lesion, no wart, no discharge, no polyp. Normal location.  Penis: Circumcised, no warts, no cracks. No dorsal Peyronie's plaques, no left corporal Peyronie's plaques, no right corporal Peyronie's plaques, no scarring,  no warts. No balanitis, no meatal stenosis.  Prostate: 40 gram or 2+ size. Left lobe normal consistency, right lobe normal consistency. Symmetrical lobes. No prostate nodule. Left lobe no tenderness, right lobe no tenderness.   Seminal Vesicles: Nonpalpable.  Sphincter Tone: Normal sphincter. No rectal tenderness. No rectal mass.    MULTI-SYSTEM PHYSICAL EXAMINATION:    Constitutional: Well-nourished. No physical deformities. Normally developed. Good grooming.  Neck: Neck symmetrical, not swollen. Normal tracheal position.  Respiratory: No labored breathing, no use of accessory muscles.   Cardiovascular: Normal temperature, normal extremity pulses, no swelling, no varicosities.  Skin: No paleness, no jaundice, no cyanosis. No lesion, no ulcer, no rash.  Neurologic / Psychiatric: Oriented to time, oriented to place, oriented to person. No depression, no anxiety, no agitation.  Gastrointestinal: No mass, no tenderness, no rigidity, non obese abdomen.  Eyes: Normal conjunctivae. Normal eyelids.  Musculoskeletal: Normal gait and station of head and neck.   PLAN:  Gill Delrossi will undergo brachytherapy today as a combined procedure with Dr. Ledon Snare

## 2016-09-13 NOTE — Anesthesia Procedure Notes (Signed)
Procedure Name: LMA Insertion Date/Time: 09/13/2016 12:02 PM Performed by: Wanita Chamberlain Pre-anesthesia Checklist: Patient identified, Timeout performed, Emergency Drugs available, Suction available and Patient being monitored Patient Re-evaluated:Patient Re-evaluated prior to inductionOxygen Delivery Method: Circle system utilized Preoxygenation: Pre-oxygenation with 100% oxygen Intubation Type: IV induction Ventilation: Mask ventilation without difficulty LMA: LMA inserted LMA Size: 4.0 Number of attempts: 2 (Attempted #5 LMA) Placement Confirmation: CO2 detector and breath sounds checked- equal and bilateral Tube secured with: Tape Dental Injury: Teeth and Oropharynx as per pre-operative assessment

## 2016-09-13 NOTE — Op Note (Signed)
Preoperative diagnosis: Clinical stage TI C adenocarcinoma the prostate  Postoperative diagnosis: Same  Procedure: I-125 prostate seed implantation with Nucletron robotic implanter  Surgeon: Bernestine Amass M.D.  Radiation Oncologist: Ledon Snare Anesthesia: Gen.  Indications: Patient  was diagnosed with clinical stage TI C prostate cancer. We had extensive discussion with him about treatment options versus. He elected to proceed with seed implantation. He underwent consultation my office as well as with Dr. Carlean Purl. He appeared to understand the advantages disadvantages potential risks of this treatment option. Full informed consent has been obtained. The patient is had preoperative ciprofloxacin. PAS compression boots were placed.  Technique and findings: Patient was brought the operating room where he had successful induction of general anesthesia. He was placed in lithotomy position and prepped and draped in usual manner. Appropriate surgical timeout was performed. Radiation oncology department placed a transrectal ultrasound probe anchoring stand. Foley catheter with contrast in the balloon was inserted without difficulty. Anchoring needles were placed within the prostate. Real-time contouring of the urethra prostate and rectum were performed and the dosing parameters were established. Targeted dose was 145 gray. We then came to the operating suite suite for placement of the needles. A second timeout was performed. All needle passage was done with real-time transrectal ultrasound guidance with the sagittal plane. A total of 21 needles were placed. See implantation itself was done with the robotic implanter. 72 active seeds were implanted. A Foley catheter was removed and flexible cystoscopy failed to show any seeds outside the prostate. The Foley catheter was inserted which drained clear urine. The patient was brought to recovery room in stable condition.

## 2016-09-13 NOTE — Transfer of Care (Signed)
Last Vitals:  Vitals:   09/13/16 0947 09/13/16 1355  BP: (!) 141/89 (!) 148/85  Pulse: 76 71  Resp: 18 (!) 8  Temp: 36.3 C 36.3 C    Last Pain:  Vitals:   09/13/16 1355  TempSrc:   PainSc: 0-No pain      Patients Stated Pain Goal: 5 (09/13/16 6816)  Immediate Anesthesia Transfer of Care Note  Patient: Nicholas Garza  Procedure(s) Performed: Procedure(s) (LRB): RADIOACTIVE SEED IMPLANT/BRACHYTHERAPY IMPLANT (N/A)  Patient Location: PACU  Anesthesia Type: General  Level of Consciousness: awake, alert  and oriented  Airway & Oxygen Therapy: Patient Spontanous Breathing and Patient connected to nasal  oxygen  Post-op Assessment: Report given to PACU RN and Post -op Vital signs reviewed and stable  Post vital signs: Reviewed and stable  Complications: No apparent anesthesia complications

## 2016-09-13 NOTE — Interval H&P Note (Signed)
History and Physical Interval Note:  09/13/2016 11:42 AM  Nicholas Garza  has presented today for surgery, with the diagnosis of PROSTATE CANCER  The various methods of treatment have been discussed with the patient and family. After consideration of risks, benefits and other options for treatment, the patient has consented to  Procedure(s): RADIOACTIVE SEED IMPLANT/BRACHYTHERAPY IMPLANT (N/A) as a surgical intervention .  The patient's history has been reviewed, patient examined, no change in status, stable for surgery.  I have reviewed the patient's chart and labs.  Questions were answered to the patient's satisfaction.     Davonne Jarnigan S

## 2016-09-13 NOTE — Anesthesia Preprocedure Evaluation (Addendum)
Anesthesia Evaluation  Patient identified by MRN, date of birth, ID band Patient awake    Reviewed: Allergy & Precautions, NPO status , Patient's Chart, lab work & pertinent test results  Airway Mallampati: II  TM Distance: >3 FB Neck ROM: Full    Dental no notable dental hx. (+) Teeth Intact, Implants, Dental Advisory Given, Caps,    Pulmonary neg pulmonary ROS,    Pulmonary exam normal breath sounds clear to auscultation       Cardiovascular hypertension, Pt. on medications Normal cardiovascular exam Rhythm:Regular Rate:Normal     Neuro/Psych negative neurological ROS  negative psych ROS   GI/Hepatic negative GI ROS, Neg liver ROS,   Endo/Other  negative endocrine ROS  Renal/GU negative Renal ROS   Prostate CA negative genitourinary   Musculoskeletal negative musculoskeletal ROS (+)   Abdominal   Peds negative pediatric ROS (+)  Hematology negative hematology ROS (+)   Anesthesia Other Findings   Reproductive/Obstetrics negative OB ROS                           Anesthesia Physical Anesthesia Plan  ASA: II  Anesthesia Plan: General   Post-op Pain Management:    Induction: Intravenous  Airway Management Planned: LMA  Additional Equipment:   Intra-op Plan:   Post-operative Plan: Extubation in OR  Informed Consent: I have reviewed the patients History and Physical, chart, labs and discussed the procedure including the risks, benefits and alternatives for the proposed anesthesia with the patient or authorized representative who has indicated his/her understanding and acceptance.   Dental advisory given  Plan Discussed with: CRNA and Surgeon  Anesthesia Plan Comments:         Anesthesia Quick Evaluation

## 2016-09-16 ENCOUNTER — Encounter (HOSPITAL_BASED_OUTPATIENT_CLINIC_OR_DEPARTMENT_OTHER): Payer: Self-pay | Admitting: Urology

## 2016-09-16 NOTE — Anesthesia Postprocedure Evaluation (Signed)
Anesthesia Post Note  Patient: Nicholas Garza  Procedure(Garza) Performed: Procedure(Garza) (LRB): RADIOACTIVE SEED IMPLANT/BRACHYTHERAPY IMPLANT (N/A)  Patient location during evaluation: PACU Anesthesia Type: General Level of consciousness: awake and alert Pain management: pain level controlled Vital Signs Assessment: post-procedure vital signs reviewed and stable Respiratory status: spontaneous breathing, nonlabored ventilation, respiratory function stable and patient connected to nasal cannula oxygen Cardiovascular status: blood pressure returned to baseline and stable Postop Assessment: no signs of nausea or vomiting Anesthetic complications: no    Last Vitals:  Vitals:   09/13/16 1415 09/13/16 1454  BP: 138/84 (!) 148/89  Pulse: 71 72  Resp: 12 16  Temp:  36.5 C    Last Pain:  Vitals:   09/13/16 1440  TempSrc:   PainSc: 0-No pain                 Nicholas Garza

## 2016-10-03 ENCOUNTER — Telehealth: Payer: Self-pay | Admitting: *Deleted

## 2016-10-03 NOTE — Telephone Encounter (Signed)
CALLED PATIENT TO REMIND OF APPTS. FOR 10-04-16, SPOKE WITH PATIENT AND HE IS AWARE OF THESE APPTS.

## 2016-10-04 ENCOUNTER — Ambulatory Visit
Admission: RE | Admit: 2016-10-04 | Discharge: 2016-10-04 | Disposition: A | Discharge status: Home / Self Care | Payer: Medicare Other | Source: Ambulatory Visit | Attending: Radiation Oncology | Admitting: Radiation Oncology

## 2016-10-04 ENCOUNTER — Encounter: Payer: Self-pay | Admitting: Radiation Oncology

## 2016-10-04 VITALS — BP 139/95 | HR 85 | Resp 16

## 2016-10-04 DIAGNOSIS — R3915 Urgency of urination: Secondary | ICD-10-CM | POA: Insufficient documentation

## 2016-10-04 DIAGNOSIS — R3911 Hesitancy of micturition: Secondary | ICD-10-CM | POA: Insufficient documentation

## 2016-10-04 DIAGNOSIS — C61 Malignant neoplasm of prostate: Secondary | ICD-10-CM | POA: Insufficient documentation

## 2016-10-04 DIAGNOSIS — Z923 Personal history of irradiation: Secondary | ICD-10-CM | POA: Diagnosis not present

## 2016-10-04 DIAGNOSIS — R300 Dysuria: Secondary | ICD-10-CM | POA: Insufficient documentation

## 2016-10-04 MED ORDER — TAMSULOSIN HCL 0.4 MG PO CAPS: 0.4000 mg | ORAL_CAPSULE | Freq: Every day | ORAL | 5 refills | Status: DC

## 2016-10-04 NOTE — Progress Notes (Signed)
  Radiation Oncology         [phone] ________________________________  Name: Nicholas Garza MRN: [id number]  Date: 10/04/2016  DOB: 12/01/1940  COMPLEX SIMULATION NOTE  NARRATIVE:  The patient was brought to the [facility] today following prostate seed implantation approximately one month ago.  Identity was confirmed.  All relevant records and images related to the planned course of therapy were reviewed.  Then, the patient was set-up supine.  CT images were obtained.  The CT images were loaded into the planning software.  Then the prostate and rectum were contoured.  Treatment planning then occurred.  The implanted iodine 125 seeds were identified by the physics staff for projection of radiation distribution  I have requested : 3D Simulation  I have requested a DVH of the following structures: Prostate and rectum.    ________________________________  Sheral Apley Tammi Klippel, M.D.  This document serves as a record of services personally performed by Tyler Pita, MD. It was created on his behalf by Maryla Morrow, a trained medical scribe. The creation of this record is based on the scribe's personal observations and the provider's statements to them. This document has been checked and approved by the attending provider.

## 2016-10-04 NOTE — Progress Notes (Signed)
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  He is complaining of increased urinary frequency and urinary hesitation symptoms. He filled out a questionnaire regarding urinary function today providing and overall IPSS score of 17 characterizing his symptoms as moderate.  His pre-implant score was 5.   Vitals stable. Denies pain. The patient denies hematuria, leakage, or incontinence. He reports urinary urgency, as well as mild dysuria. He denies any bowl complaints at this time. He reports his energy level is gradually improving. The patient reports he does not have a follow up appointment with Dr. Risa Grill scheduled at this time.  ALLERGIES:  has No Known Allergies.  Meds: Current Outpatient Prescriptions  Medication Sig Dispense Refill  . fish oil-omega-3 fatty acids 1000 MG capsule Take 2 g by mouth daily.    . Multiple Vitamins-Minerals (MULTIVITAMIN & MINERAL PO) Take 1 tablet by mouth daily.    . psyllium (METAMUCIL) 58.6 % powder Take 1 packet by mouth daily.    . sildenafil (VIAGRA) 100 MG tablet Take 1 tablet (100 mg total) by mouth daily as needed for erectile dysfunction. 10 tablet 5   No current facility-administered medications for this encounter.     Physical Findings: The patient is in no acute distress. Patient is alert and oriented.  blood pressure is 139/95 (abnormal) and his pulse is 85. His respiration is 16 and oxygen saturation is 100%.  No significant changes.  Lab Findings: Lab Results  Component Value Date   WBC 5.3 09/06/2016   HGB 14.6  09/06/2016   HCT 42.7 09/06/2016   MCV 92.0 09/06/2016   PLT 208 09/06/2016    Radiographic Findings:  Patient underwent CT imaging in our clinic for post implant dosimetry. The CT appears to demonstrate an adequate distribution of radioactive seeds throughout the prostate gland. There no seeds in her near the rectum. I suspect the final radiation plan and dosimetry will show appropriate coverage of the prostate gland.   Impression: The patient is recovering from the effects of radiation. His urinary symptoms should gradually improve over the next 4-6 months. We talked about this today. He is encouraged by his improvement already and is otherwise please with his outcome.   Plan: Today, I spent time talking to the patient about his prostate seed implant and resolving urinary symptoms. We also talked about long-term follow-up for prostate cancer following seed implant. He understands that ongoing PSA determinations and digital rectal exams will help perform surveillance to rule out disease recurrence. He understands what to expect with his PSA measures. Patient was also educated today about some of the long-term effects from radiation including a small risk for rectal bleeding and possibly erectile dysfunction. We talked about some of the general management approaches to these potential complications. However, I did encourage the patient to contact our office or return at any point if he has questions or concerns related to his previous radiation and prostate cancer. I will prescribe Flomax to help with the patient's nocturia.  _____________________________________  Sheral Apley. Tammi Klippel, M.D.  This document serves as  a record of services personally performed by Tyler Pita, MD. It was created on his behalf by Maryla Morrow, a trained medical scribe. The creation of this record is based on the scribe's personal observations and the provider's statements to them. This document has been checked and  approved by the attending provider.

## 2016-10-04 NOTE — Progress Notes (Signed)
Vitals stable. Denies pain. Pre seed IPSS 5. Post seed IPSS 17. Denies hematuria, leakage or incontinence. Reports urgency. Reports mild dysuria. Denies any bowel complaints. Reports his energy level is gradually improving. Patient does not have a follow up appointment scheduled with [facility].   BP (!) 139/95   Pulse 85   Resp 16   SpO2 100%  Wt Readings from Last 3 Encounters:  09/13/16 211 lb 8 oz (95.9 kg)  08/13/16 214 lb 3.2 oz (97.2 kg)  07/10/16 213 lb 12.8 oz (97 kg)

## 2016-10-07 ENCOUNTER — Telehealth: Payer: Self-pay | Admitting: *Deleted

## 2016-10-07 NOTE — Telephone Encounter (Signed)
CALLED PATIENT TO INFORM OF APPT. FOR 10-09-16 - ARRIVAL TIME - 10 AM WITH NP, LARRY GIST @ [facility], SPOKE WITH PATIENT AND HE IS AWARE OF THIS APPT.

## 2016-10-07 NOTE — Addendum Note (Signed)
Encounter addended by: Heywood Footman, RN on: 10/07/2016 11:36 AM<BR>    Actions taken: Charge Capture section accepted

## 2016-10-09 ENCOUNTER — Ambulatory Visit: Payer: Medicare Other | Attending: Radiation Oncology

## 2016-10-09 DIAGNOSIS — Z510 Encounter for antineoplastic radiation therapy: Secondary | ICD-10-CM | POA: Diagnosis not present

## 2016-10-09 DIAGNOSIS — C61 Malignant neoplasm of prostate: Secondary | ICD-10-CM | POA: Insufficient documentation

## 2016-10-12 NOTE — Progress Notes (Signed)
  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 He underwent CT scan complex simulation to delineate the three-dimensional structures of the pelvis and demonstrate the radiation distribution.  Since that time, the seed localization, and complex isodose planning with dose volume histograms have now been completed.  Results:   Prostate Coverage - The dose of radiation delivered to the 90% or more of the prostate gland (D90) was 112.52% of the prescription dose. This exceeds our goal of greater than 90%. Rectal Sparing - The volume of rectal tissue receiving the prescription dose or higher was 0.0 cc. This falls under our thresholds tolerance of 1.0 cc.  Impression: The prostate seed implant appears to show adequate target coverage and appropriate rectal sparing.  Plan:  The patient will continue to follow with urology for ongoing PSA determinations. I would anticipate a high likelihood for local tumor control with minimal risk for rectal morbidity.  ________________________________  Sheral Apley Tammi Klippel, M.D.

## 2016-10-24 ENCOUNTER — Encounter: Payer: Self-pay | Admitting: Sports Medicine

## 2016-10-24 ENCOUNTER — Ambulatory Visit (INDEPENDENT_AMBULATORY_CARE_PROVIDER_SITE_OTHER): Payer: Medicare Other | Admitting: Sports Medicine

## 2016-10-24 ENCOUNTER — Ambulatory Visit (INDEPENDENT_AMBULATORY_CARE_PROVIDER_SITE_OTHER): Payer: Medicare Other

## 2016-10-24 DIAGNOSIS — M1711 Unilateral primary osteoarthritis, right knee: Secondary | ICD-10-CM

## 2016-10-24 DIAGNOSIS — M11261 Other chondrocalcinosis, right knee: Secondary | ICD-10-CM

## 2016-10-24 DIAGNOSIS — M170 Bilateral primary osteoarthritis of knee: Secondary | ICD-10-CM | POA: Insufficient documentation

## 2016-10-24 MED ORDER — MELOXICAM 15 MG PO TABS: ORAL_TABLET | ORAL | 3 refills | Status: DC

## 2016-10-24 NOTE — Progress Notes (Signed)
   Subjective:    I'm seeing this patient as a consultation for:  Dr. Teressa Lower  CC:  Right knee pain  HPI: For years this pleasant 76 year old male has had pain that he localizes along the medial joint line of his right knee, moderate, persistent with occasional pain that is sharp when he twists. Pain doesn't radiate. He has tried some Tylenol but no anti-inflammatories.  Past medical history:  Negative.  See flowsheet/record as well for more information.  Surgical history: Negative.  See flowsheet/record as well for more information.  Family history: Negative.  See flowsheet/record as well for more information.  Social history: Negative.  See flowsheet/record as well for more information.  Allergies, and medications have been entered into the medical record, reviewed, and no changes needed.   Review of Systems: No headache, visual changes, nausea, vomiting, diarrhea, constipation, dizziness, abdominal pain, skin rash, fevers, chills, night sweats, weight loss, swollen lymph nodes, body aches, joint swelling, muscle aches, chest pain, shortness of breath, mood changes, visual or auditory hallucinations.   Objective:   General: Well Developed, well nourished, and in no acute distress.  Neuro/Psych: Alert and oriented x3, extra-ocular muscles intact, able to move all 4 extremities, sensation grossly intact. Skin: Warm and dry, no rashes noted.  Respiratory: Not using accessory muscles, speaking in full sentences, trachea midline.  Cardiovascular: Pulses palpable, no extremity edema. Abdomen: Does not appear distended. Right Knee: Normal to inspection with no erythema or effusion or obvious bony abnormalities. Tender to palpation at the medial joint line ROM normal in flexion and extension and lower leg rotation. Ligaments with solid consistent endpoints including ACL, PCL, LCL, MCL. Negative Mcmurray's and provocative meniscal tests. Non painful patellar compression. Patellar and  quadriceps tendons unremarkable. Hamstring and quadriceps strength is normal.  X-rays personally reviewed and show moderate osteoarthritis  Impression and Recommendations:   This case required medical decision making of moderate complexity.  Primary osteoarthritis of right knee We are going to start conservatively, Mobic, x-rays, knee brace. Rehabilitation exercises given.  Return in one month, injection if no better.

## 2016-10-24 NOTE — Assessment & Plan Note (Signed)
We are going to start conservatively, Mobic, x-rays, knee brace. Rehabilitation exercises given.  Return in one month, injection if no better.

## 2016-11-11 ENCOUNTER — Telehealth: Payer: Self-pay | Admitting: Radiation Oncology

## 2016-11-11 NOTE — Telephone Encounter (Signed)
I would suggest immodium AD.  If the diarrhea persists, then, consider stopping flomax

## 2016-11-11 NOTE — Telephone Encounter (Signed)
Returned patient's call. Patient reports diarrhea x 2 weeks. Patient questions if his diarrhea could be related to Flomax. Reports he began taking Flomax a few days after he was prescribed it on 10/04/16. Explains that at first he felt like the diarrhea was a stomach bug. Reports today he has gone to the bathroom 15 times with diarrhea. Reports when he voids he has to sit because stool passes as well. Reports he believes he has blood in his stool but, can't say for certain. Reports nocturia is less (2-3) with Flomax. Denies dysuria or hematuria. Understands this RN will inform Dr. Tammi Klippel of these findings and call him back with further directions.

## 2016-11-12 ENCOUNTER — Telehealth: Payer: Self-pay | Admitting: Radiation Oncology

## 2016-11-12 NOTE — Telephone Encounter (Signed)
Phoned patient to discuss Nicholas Garza's recommendation of Imodium to manage diarrhea. Phoned patient's cell and home. No answer. Left message on both. Patient immediately returned my call. Patient reports since our conversation yesterday he remembers that on 10/24/16 his knee doctor prescribed him meloxicam. Patient states,"my upset stomach and diarrhea truly began after I started taking meloxicam not flomax." Patient goes onto explain that he didn't take the meloxicam this morning and his GI upset/diarrhea has almost completely resolved. Encouraged patient to follow up with his knee doctor about an alternative to the meloxicam. Also, encouraged him to continue Flomax as directed since it seemed to be helping his empty his bladder. Patient verbalized understanding and expressed appreciation for the return call.

## 2016-11-21 ENCOUNTER — Ambulatory Visit (INDEPENDENT_AMBULATORY_CARE_PROVIDER_SITE_OTHER): Payer: Medicare Other | Admitting: Sports Medicine

## 2016-11-21 ENCOUNTER — Encounter: Payer: Self-pay | Admitting: Sports Medicine

## 2016-11-21 DIAGNOSIS — M1711 Unilateral primary osteoarthritis, right knee: Secondary | ICD-10-CM | POA: Diagnosis not present

## 2016-11-21 MED ORDER — PANTOPRAZOLE SODIUM 40 MG PO TBEC: 40.0000 mg | DELAYED_RELEASE_TABLET | Freq: Every day | ORAL | 3 refills | Status: DC

## 2016-11-21 MED ORDER — CELECOXIB 200 MG PO CAPS: ORAL_CAPSULE | ORAL | 2 refills | Status: DC

## 2016-11-21 NOTE — Assessment & Plan Note (Signed)
Question mild GI bleed that has resolved with meloxicam. He is taking ibuprofen every day, he will stop this. Adding pantoprazole and switching to Celebrex to use as needed. He was cautioned that he is to make a follow-up appointment with his PCP next week to discuss his GI bleed, and consider GI referral and labs. Ultimately his knee pain did resolve.

## 2016-11-21 NOTE — Progress Notes (Signed)
  Subjective:    CC:  knee pain  HPI:  This is a pleasant 76 year old male who presents for follow up on knee pain. He states his knee pain has greatly improved and he has been wearing a compressive sleeve over the knee. Patient was started on Mobic at last appointment and stated he took it for one week and then began to experience diarrhea and dark, tarry stools. Denies abdominal pain. He relays he stopped taking his mobic and after a few days the diarrhea and dark, tarry stools subsided however he then began experiencing constipation. Has taken dulcolax with relief. Patient reports that he has continued to take Ibuprofen at night for knee pain.   Past medical history:  Negative.  See flowsheet/record as well for more information.  Surgical history: Negative.  See flowsheet/record as well for more information.  Family history: Negative.  See flowsheet/record as well for more information.  Social history: Negative.  See flowsheet/record as well for more information.  Allergies, and medications have been entered into the medical record, reviewed, and no changes needed.   Review of Systems: No fevers, chills, night sweats, weight loss, chest pain, or shortness of breath.   Objective:    General: Well Developed, well nourished, and in no acute distress.  Neuro: Alert and oriented x3, extra-ocular muscles intact, sensation grossly intact.  HEENT: Normocephalic, atraumatic, pupils equal round reactive to light, neck supple, no masses, no lymphadenopathy, thyroid nonpalpable.  Skin: Warm and dry, no rashes. Cardiac: Regular rate and rhythm, no murmurs rubs or gallops, no lower extremity edema.  Respiratory: Clear to auscultation bilaterally. Not using accessory muscles, speaking in full sentences. Abdomen: No tenderness to palpation. Bowel sounds normal.  Musculoskeletal: Mild tenderness over the medial joint line.   Impression and Recommendations:   Osteoarthritis of right knee: - Knee pain  has greatly improved. Will begin Celebrex for residual knee pain. Encouraged patient to continue to wear compression brace as needed and follow up if knee pain worsens as we can give injection.  GI bleed: - Stools no longer dark and tarry so bleed has most likely resolved. Mobic stopped and patient advised to avoid Ibuprofen. Follow up with PCP for supervision of GI bleed.  Constipation: - Patient encouraged to begin Miralax for constipation.

## 2017-01-08 DIAGNOSIS — C61 Malignant neoplasm of prostate: Secondary | ICD-10-CM | POA: Diagnosis not present

## 2017-03-26 ENCOUNTER — Encounter: Payer: Self-pay | Admitting: Pulmonary Disease

## 2017-03-26 ENCOUNTER — Other Ambulatory Visit (INDEPENDENT_AMBULATORY_CARE_PROVIDER_SITE_OTHER): Payer: Medicare Other

## 2017-03-26 ENCOUNTER — Ambulatory Visit (INDEPENDENT_AMBULATORY_CARE_PROVIDER_SITE_OTHER): Payer: Medicare Other | Admitting: Pulmonary Disease

## 2017-03-26 VITALS — BP 128/80 | HR 69 | Temp 96.7°F | Ht 72.0 in | Wt 207.1 lb

## 2017-03-26 DIAGNOSIS — I471 Supraventricular tachycardia: Secondary | ICD-10-CM

## 2017-03-26 DIAGNOSIS — F419 Anxiety disorder, unspecified: Secondary | ICD-10-CM | POA: Diagnosis not present

## 2017-03-26 DIAGNOSIS — R9389 Abnormal findings on diagnostic imaging of other specified body structures: Secondary | ICD-10-CM

## 2017-03-26 DIAGNOSIS — I499 Cardiac arrhythmia, unspecified: Secondary | ICD-10-CM | POA: Insufficient documentation

## 2017-03-26 DIAGNOSIS — K5730 Diverticulosis of large intestine without perforation or abscess without bleeding: Secondary | ICD-10-CM | POA: Diagnosis not present

## 2017-03-26 DIAGNOSIS — I10 Essential (primary) hypertension: Secondary | ICD-10-CM

## 2017-03-26 DIAGNOSIS — E785 Hyperlipidemia, unspecified: Secondary | ICD-10-CM

## 2017-03-26 DIAGNOSIS — R938 Abnormal findings on diagnostic imaging of other specified body structures: Secondary | ICD-10-CM

## 2017-03-26 DIAGNOSIS — M1711 Unilateral primary osteoarthritis, right knee: Secondary | ICD-10-CM | POA: Diagnosis not present

## 2017-03-26 DIAGNOSIS — C61 Malignant neoplasm of prostate: Secondary | ICD-10-CM

## 2017-03-26 LAB — BASIC METABOLIC PANEL
BUN: 21 mg/dL (ref 6–23)
CHLORIDE: 105 meq/L (ref 96–112)
CO2: 30 meq/L (ref 19–32)
Calcium: 9.2 mg/dL (ref 8.4–10.5)
Creatinine, Ser: 0.78 mg/dL (ref 0.40–1.50)
GFR: 103.43 mL/min (ref >60.00)
GLUCOSE: 94 mg/dL (ref 70–99)
POTASSIUM: 4.5 meq/L (ref 3.5–5.1)
Sodium: 139 mEq/L (ref 135–145)

## 2017-03-26 LAB — LIPID PANEL
CHOL/HDL RATIO: 3
Cholesterol: 172 mg/dL (ref 0–200)
HDL: 52.1 mg/dL (ref >39.00)
LDL CALC: 109 mg/dL — AB (ref 0–99)
NONHDL: 119.86
Triglycerides: 56 mg/dL (ref 0.0–149.0)
VLDL: 11.2 mg/dL (ref 0.0–40.0)

## 2017-03-26 LAB — TSH: TSH: 2.37 u[IU]/mL (ref 0.35–4.50)

## 2017-03-26 MED ORDER — DILTIAZEM HCL ER COATED BEADS 120 MG PO CP24: 120.0000 mg | ORAL_CAPSULE | Freq: Every day | ORAL | 11 refills | Status: DC
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
## 2017-03-26 NOTE — Patient Instructions (Signed)
Today we updated your med list in our EPIC system...    Continue your current medications the same...  Today we caught a cardiac arrhythmia-- your heart was racing (rapid & regular) and the EKG showed a supraventricular tachyarrhythmia (SVT)...  We are checking some additional blood work today (we will contact you w/ the results when available) & we are starting a new medication to hopefully prevent tese episodes from occuriing...    Currently you told me you notice this arrhythmia about 3-4 x per week (about every other day)...    Start the new CARDIZEM-CD 141m one tab daily...    And watch for the frequency of this arrhythmia going forward-- hopefully it will diminish & we will adjust the med accordingly...  Call for any questions...  Let's plan a follow up visit in 4-6 week, sooner if needed for problems..Marland KitchenMarland Kitchen

## 2017-04-08 ENCOUNTER — Telehealth: Payer: Self-pay | Admitting: Radiation Oncology

## 2017-04-08 NOTE — Telephone Encounter (Signed)
Received voicemail message from patient requesting a refill of his Flomax. Phoned patient back. No answer. Left detailed message. Encouraging him to request refill from Dr. Risa Grill, his urologist at [facility], since he hasn't been seen in our office since January and has no follow up appointment set. Encouraged patient to phoned this RN back with further questions or concerns and provided my contact information.

## 2017-04-09 ENCOUNTER — Telehealth: Payer: Self-pay | Admitting: Pulmonary Disease

## 2017-04-09 DIAGNOSIS — C61 Malignant neoplasm of prostate: Secondary | ICD-10-CM

## 2017-04-09 NOTE — Telephone Encounter (Signed)
Called and spoke to pt. Pt is requesting a refill of Flomax 0.34m. This is typically filled by Dr. MTammi Klippelbut when pt needed refill and contacted Dr. MJohny Shearsoffice the pt was advised to contact SN for refill. Pt last seen in 02/2017.   Dr. NLenna Gilford please advise if you are ok with Flomax refill. Thanks.   No Known Allergies  Current Outpatient Prescriptions on File Prior to Visit  Medication Sig Dispense Refill  . diltiazem (DILTIAZEM CD) 120 MG 24 hr capsule Take 1 capsule (120 mg total) by mouth daily. 30 capsule 11  . fish oil-omega-3 fatty acids 1000 MG capsule Take 2 g by mouth daily.    . Multiple Vitamins-Minerals (MULTIVITAMIN & MINERAL PO) Take 1 tablet by mouth daily.    . psyllium (METAMUCIL) 58.6 % powder Take 1 packet by mouth daily.    . sildenafil (VIAGRA) 100 MG tablet Take 1 tablet (100 mg total) by mouth daily as needed for erectile dysfunction. 10 tablet 5  . tamsulosin (FLOMAX) 0.4 MG CAPS capsule Take 1 capsule (0.4 mg total) by mouth daily after supper. 30 capsule 5   No current facility-administered medications on file prior to visit.

## 2017-04-10 MED ORDER — TAMSULOSIN HCL 0.4 MG PO CAPS: 0.4000 mg | ORAL_CAPSULE | Freq: Every day | ORAL | 11 refills | Status: DC

## 2017-04-10 NOTE — Telephone Encounter (Signed)
Per SN---  Ok to refill medication for the pt.  This has been sent to the pharmacy per pts request and I have called and lmom to make him aware.

## 2017-05-07 ENCOUNTER — Ambulatory Visit (INDEPENDENT_AMBULATORY_CARE_PROVIDER_SITE_OTHER): Payer: Medicare Other | Admitting: Pulmonary Disease

## 2017-05-07 VITALS — BP 138/80 | HR 67 | Temp 97.6°F | Ht 72.0 in | Wt 207.0 lb

## 2017-05-07 DIAGNOSIS — R938 Abnormal findings on diagnostic imaging of other specified body structures: Secondary | ICD-10-CM

## 2017-05-07 DIAGNOSIS — I471 Supraventricular tachycardia: Secondary | ICD-10-CM | POA: Diagnosis not present

## 2017-05-07 DIAGNOSIS — I10 Essential (primary) hypertension: Secondary | ICD-10-CM

## 2017-05-07 DIAGNOSIS — E785 Hyperlipidemia, unspecified: Secondary | ICD-10-CM

## 2017-05-07 DIAGNOSIS — K5730 Diverticulosis of large intestine without perforation or abscess without bleeding: Secondary | ICD-10-CM | POA: Diagnosis not present

## 2017-05-07 DIAGNOSIS — C61 Malignant neoplasm of prostate: Secondary | ICD-10-CM

## 2017-05-07 DIAGNOSIS — M1711 Unilateral primary osteoarthritis, right knee: Secondary | ICD-10-CM | POA: Diagnosis not present

## 2017-05-07 DIAGNOSIS — R9389 Abnormal findings on diagnostic imaging of other specified body structures: Secondary | ICD-10-CM

## 2017-05-07 NOTE — Patient Instructions (Signed)
Today we updated your med list in our EPIC system...    Continue your current medications the same...  Today we rechecked your EKG...  Keep up the good work w/ diet & exercise...    Continue to minimize caffeine etc...  Call for any questions...  Let's plan a follow up visit in 66mo sooner if needed for problems..Marland KitchenMarland Kitchen

## 2017-05-08 ENCOUNTER — Encounter: Payer: Self-pay | Admitting: Pulmonary Disease
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
## 2017-07-14 DIAGNOSIS — M76822 Posterior tibial tendinitis, left leg: Secondary | ICD-10-CM | POA: Diagnosis not present

## 2017-07-14 DIAGNOSIS — M722 Plantar fascial fibromatosis: Secondary | ICD-10-CM | POA: Diagnosis not present

## 2017-07-14 DIAGNOSIS — M7752 Other enthesopathy of left foot: Secondary | ICD-10-CM | POA: Diagnosis not present

## 2017-07-16 DIAGNOSIS — C61 Malignant neoplasm of prostate: Secondary | ICD-10-CM | POA: Diagnosis not present

## 2017-07-16 DIAGNOSIS — Z23 Encounter for immunization: Secondary | ICD-10-CM | POA: Diagnosis not present

## 2017-07-23 DIAGNOSIS — N401 Enlarged prostate with lower urinary tract symptoms: Secondary | ICD-10-CM | POA: Diagnosis not present

## 2017-08-11 ENCOUNTER — Telehealth: Payer: Self-pay | Admitting: Pulmonary Disease

## 2017-08-11 NOTE — Telephone Encounter (Signed)
Pt states that X1 week he has noticed intermittent tachycardia.  Pt wants to know if he needs to change meds or increase dose of diltiazem.    Pt uses [facility] on [address]   SN please advise.  Thanks!

## 2017-08-12 MED ORDER — DILTIAZEM HCL ER COATED BEADS 240 MG PO TB24: 240.0000 mg | ORAL_TABLET | Freq: Every day | ORAL | 1 refills | Status: DC

## 2017-08-12 NOTE — Telephone Encounter (Signed)
Called pt and advised message from the provider. Pt understood and verbalized understanding. Nothing further is needed.   Spoke with pt and advised rx sent to pharmacy   

## 2017-08-12 NOTE — Telephone Encounter (Signed)
Per SN it is okay to increase the dose to 127m on diltiazem medication. He recommends to use the generic medication of dilitiazem which is cardizem 2440m One QD #30 or #90 with refills. Per SN he requests a one month follow up and than as needed afterwards. Routing this message back to triage.

## 2017-11-10 ENCOUNTER — Encounter: Payer: Self-pay | Admitting: Pulmonary Disease

## 2017-11-10 ENCOUNTER — Ambulatory Visit: Payer: Medicare Other | Admitting: Pulmonary Disease

## 2017-11-10 VITALS — BP 118/78 | HR 62 | Ht 72.0 in | Wt 206.4 lb

## 2017-11-10 DIAGNOSIS — R9389 Abnormal findings on diagnostic imaging of other specified body structures: Secondary | ICD-10-CM

## 2017-11-10 DIAGNOSIS — K5730 Diverticulosis of large intestine without perforation or abscess without bleeding: Secondary | ICD-10-CM

## 2017-11-10 DIAGNOSIS — I471 Supraventricular tachycardia: Secondary | ICD-10-CM | POA: Diagnosis not present

## 2017-11-10 DIAGNOSIS — E785 Hyperlipidemia, unspecified: Secondary | ICD-10-CM

## 2017-11-10 DIAGNOSIS — I10 Essential (primary) hypertension: Secondary | ICD-10-CM | POA: Diagnosis not present

## 2017-11-10 DIAGNOSIS — C61 Malignant neoplasm of prostate: Secondary | ICD-10-CM

## 2017-11-10 DIAGNOSIS — M1711 Unilateral primary osteoarthritis, right knee: Secondary | ICD-10-CM

## 2017-11-10 MED ORDER — TRAMADOL HCL 50 MG PO TABS: 50.0000 mg | ORAL_TABLET | Freq: Three times a day (TID) | ORAL | 0 refills | Status: DC | PRN

## 2017-11-10 NOTE — Progress Notes (Signed)
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 & 593mnodule remaining=> we will continue to follow...  HBP>  Controlled on diet alone & reminded no salt, & keep wt down...  Palpitations w/ PAT>  This was noted [id number] Nicholas Garza was placed on CardizemCD120 w/ good control &  resolution of his arrhythmia... 05/07/17>   ChRhyders much improved and asked to continue the CardizemCD120 daily;  Nicholas Garza will remain active, exercising, working on cars, etc... Nicholas Garza knows to call for any problems & we plan rov recheck in 28m48mo 11/10/17>   Nicholas Garza is stable- breathing good, BP controlled, no further PAT episodes;  Nicholas Garza continues to f/u w/ Urology Q28mo23moe discussed trial Tramadol50 and f/u appt in 28mo 50moXR & FLabs  CHOL>  On diet alone & numbers ok but LDL not at goal & will work on wt reduction...  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  ED, elev PSA=> prostate cancer>  PSA has been 3-4 range; requests Rx for Viagra 100mg.49m/15> PSA= 4.03 & we will recheck in 28mo =>20modid not return... 6/17> PSA= 7.70 & pt referred to Urology for Bx & Rx => prostate cancer w/ eval 7 Rx by DrGrapey & DrManning...  Other medical problems as noted...     Plan:     Patient's Medications  New Prescriptions   TRAMADOL (ULTRAM) 50 MG TABLET    Take 1 tablet (50 mg total) by mouth 3 (three) times daily as needed.  Previous Medications   DILTIAZEM (DILTIAZEM CD) 120 MG 24 HR CAPSULE    Take 1 capsule (120 mg total) by mouth daily.   FISH OIL-OMEGA-3 FATTY ACIDS 1000 MG CAPSULE    Take 2 g by mouth daily.   MULTIPLE VITAMINS-MINERALS (MULTIVITAMIN & MINERAL PO)    Take 1 tablet by mouth daily.   PSYLLIUM (METAMUCIL) 58.6 % POWDER    Take 1 packet by mouth daily.   SILDENAFIL (VIAGRA) 100 MG TABLET    Take 1 tablet (100 mg total) by mouth daily as needed for erectile dysfunction.   TAMSULOSIN (FLOMAX) 0.4 MG CAPS CAPSULE    Take 1 capsule (0.4 mg total) by mouth daily after supper.  Modified Medications   No medications on file  Discontinued Medications   DILTIAZEM (CARDIZEM LA) 240 MG 24 HR TABLET    Take 1 tablet (240 mg total) daily by mouth.

## 2017-11-10 NOTE — Patient Instructions (Signed)
Today we updated your med list in our EPIC system...    Continue your current medications the same...  We wrote a new prescription for TRAMADOL 32m tabs - take one tab up to 3 times daily as needed for the arthritis pain (HINT: take it with an extra-strength Tylenol to potentiate it's affect)...  Call for any questions...  Let's plan a follow up visit in 646mo/ fasting labs etc...[name]

## 2017-11-24 IMAGING — CT CT CHEST W/O CM
2 of 3 series · 15 of 36 positions shown, 18 images · non-contrast
Comparison: Chest radiographs 04/17/2016 and 03/26/2016.

CLINICAL DATA: Abnormal chest radiograph.

EXAM:
CT CHEST WITHOUT CONTRAST
TECHNIQUE: Multidetector CT imaging of the chest was performed following the
standard protocol without IV contrast.

[Series 2: thorax · axial · 0.69mm/px · z∈[-366,-101]mm · 12 of 63 slices shown, 15 images]
[im 5/63  mediastinal]
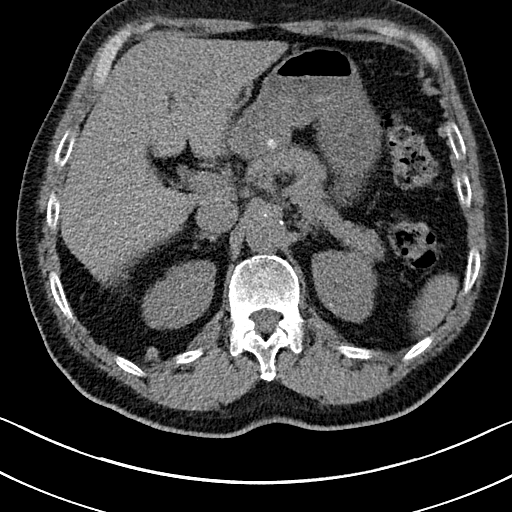
[im 5/63  lung]
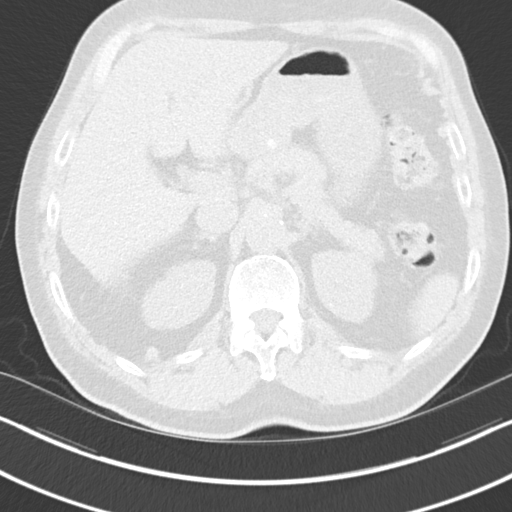
[im 10/63  lung]
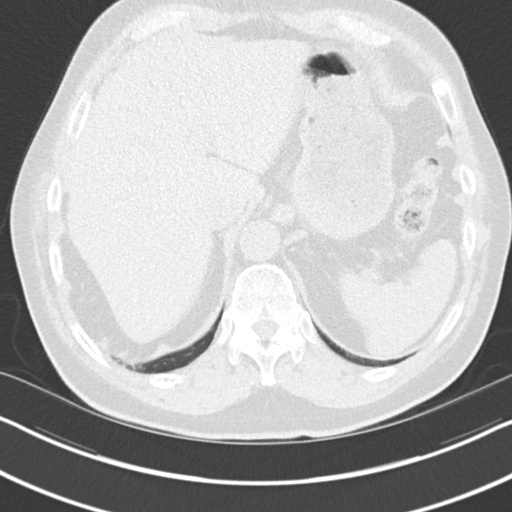
[im 14/63  lung]
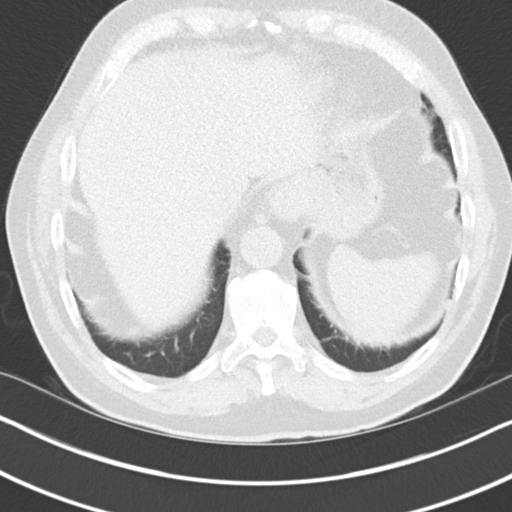
[im 19/63  lung]
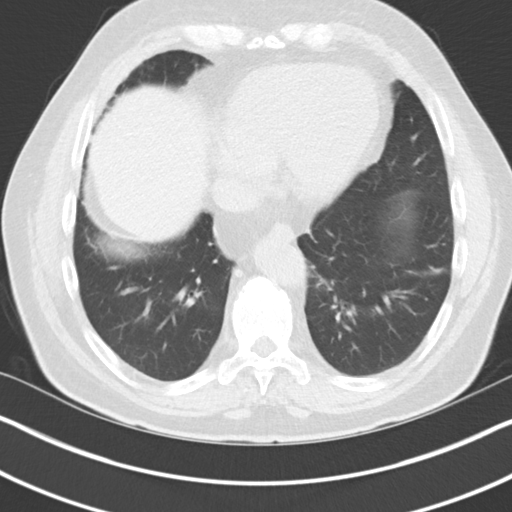
[im 23/63  mediastinal]
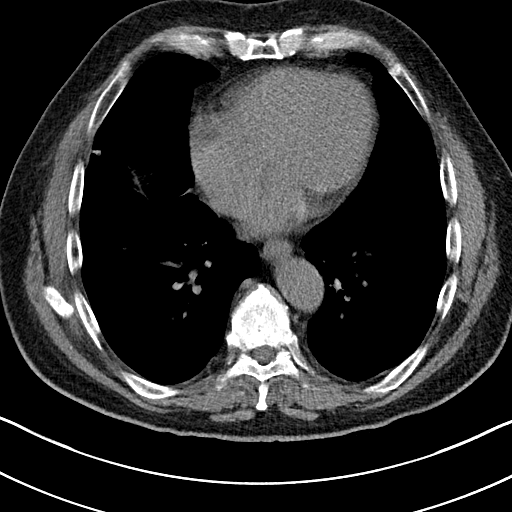
[im 23/63  lung]
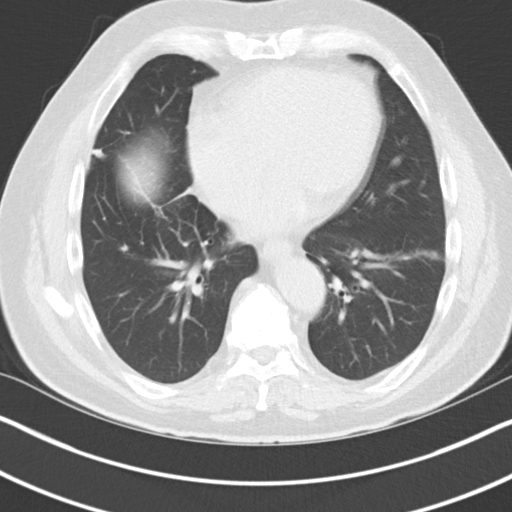
[im 28/63  lung]
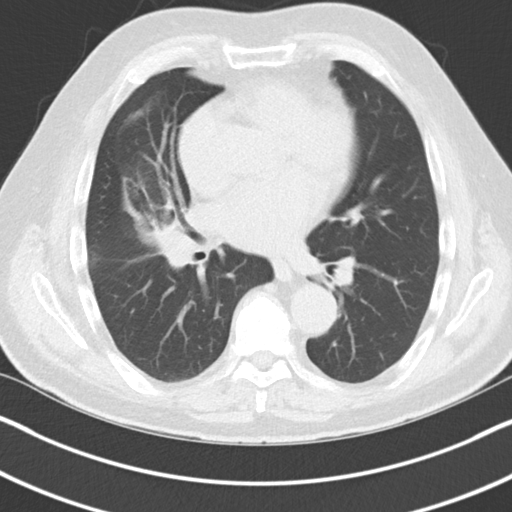
[im 35/63  lung]
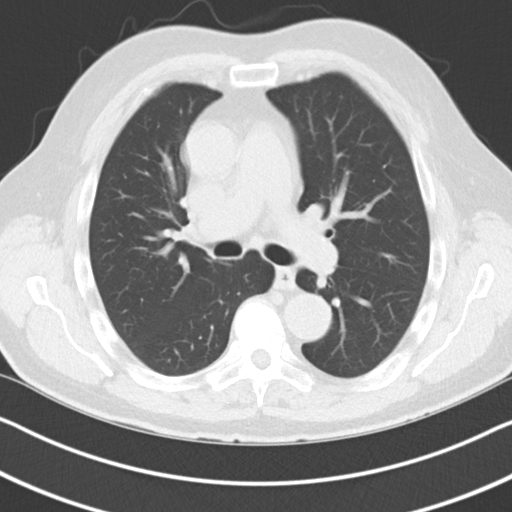
[im 40/63  lung]
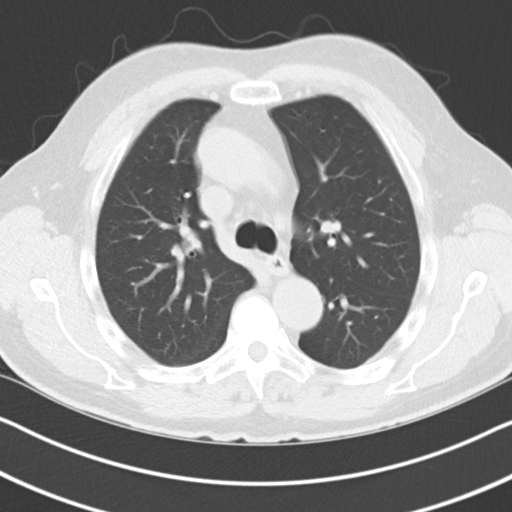
[im 44/63  mediastinal]
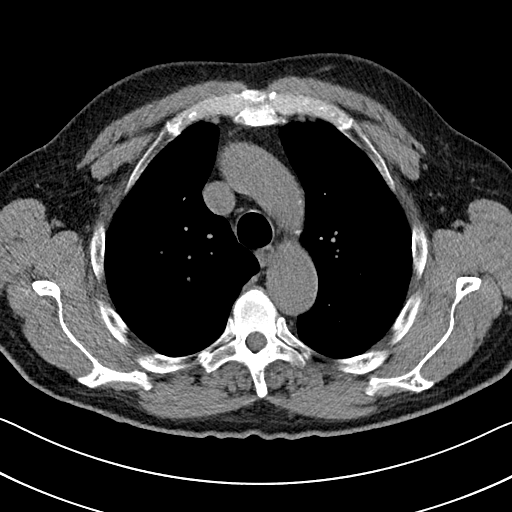
[im 44/63  lung]
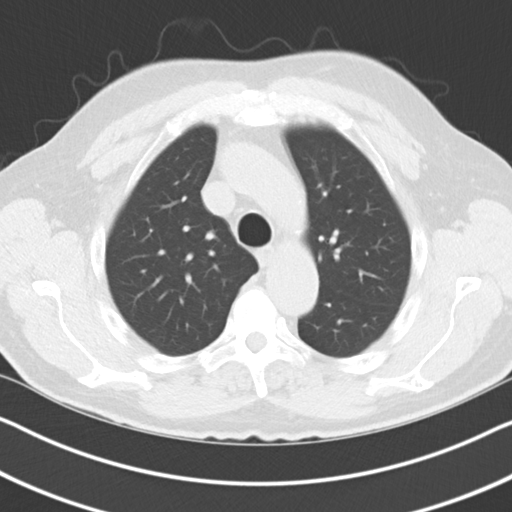
[im 49/63  lung]
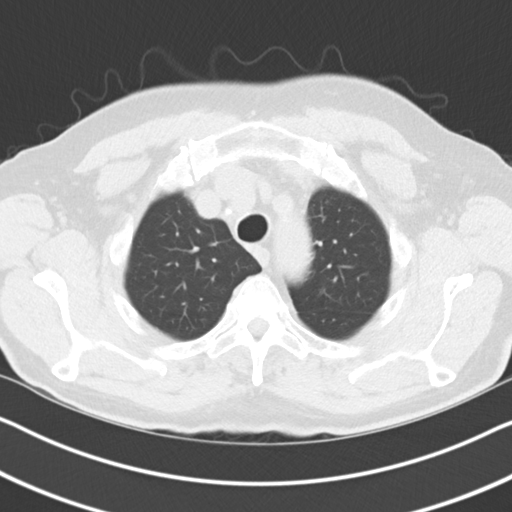
[im 53/63  lung]
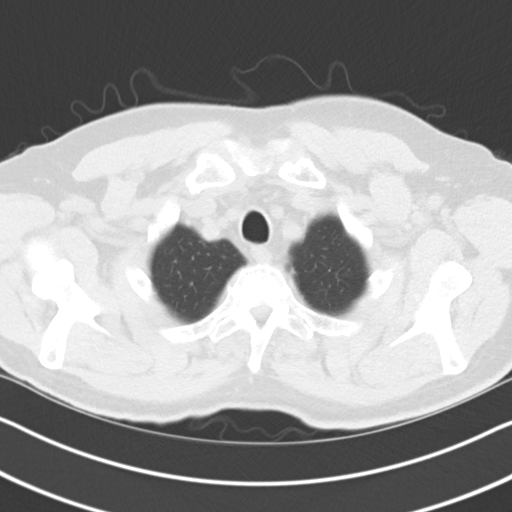
[im 58/63  lung]
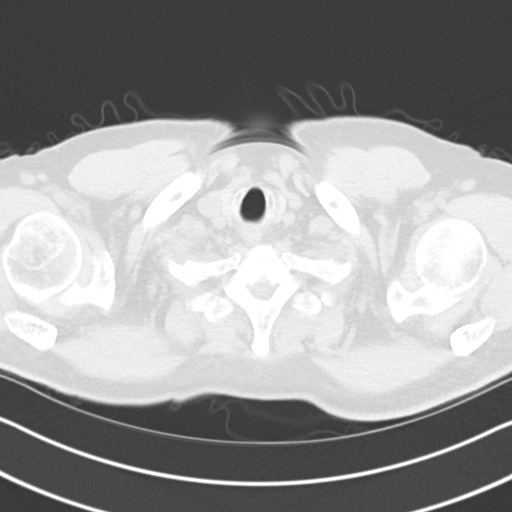

[Series 5: coronal · coronal · 0.64mm/px · 3 of 82 slices shown]
[im 17/82  lung]
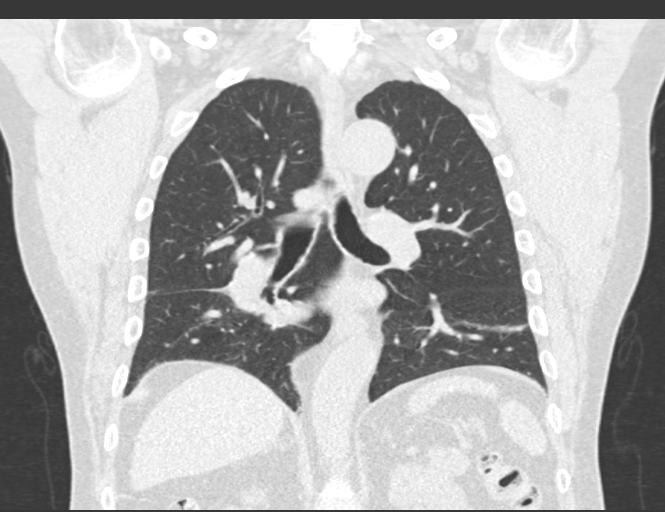
[im 33/82  lung]
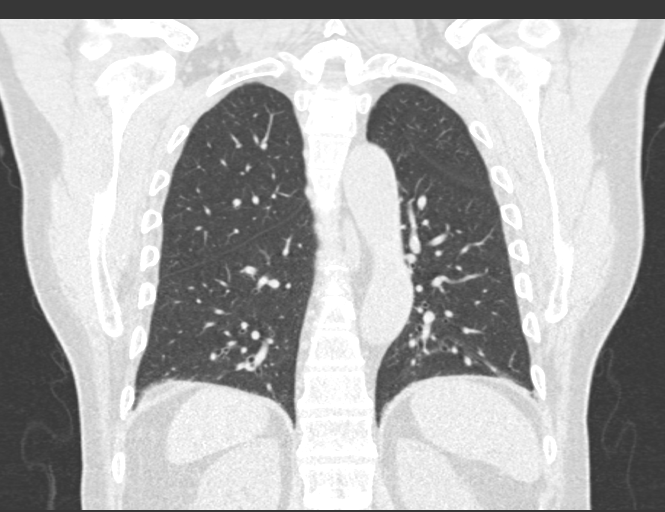
[im 49/82  lung]
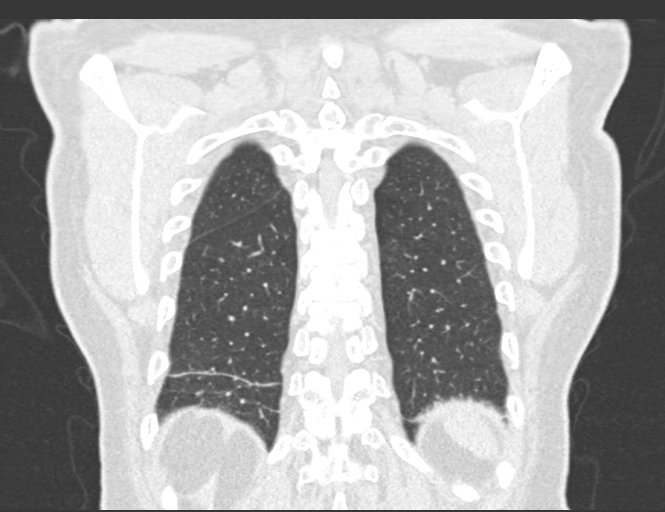

[15 of 36 positions shown; findings below may reference images not displayed]

FINDINGS: Cardiovascular: Atherosclerotic calcification of the arterial
vasculature. Heart size normal. No pericardial effusion.

Mediastinum/Nodes: No pathologically enlarged mediastinal or
axillary lymph nodes. Right hilum appears full but this area is
poorly evaluated without IV contrast.

Lungs/Pleura: Volume loss and mild bronchiectasis in the right
middle lobe correspond to the questioned abnormality on recent chest
radiograph. Minimal volume loss in both lower lobes, adjacent to the
hemidiaphragms. No pleural fluid. Airway is unremarkable.

Upper Abdomen: Visualized portions of the liver, gallbladder,
adrenal glands and right kidney are unremarkable. Small stone the
left kidney. Visualized portions of the spleen, pancreas, stomach
and bowel are unremarkable with the exception of a tiny hiatal
hernia.

Musculoskeletal: No worrisome lytic or sclerotic lesions.
Peripherally sclerotic and well-circumscribed lesion in a posterior
right rib (Series 2, image 36) has a benign appearance.
IMPRESSION: 1. Right hilar fullness with volume loss in the right middle lobe.
Right hilum is poorly evaluated without IV contrast. Follow-up CT
chest with contrast is recommended in 3-4 weeks to ensure resolution
and exclude a centrally obstructing mass.
2. Aortic atherosclerosis.
3. Small left renal stone.

## 2017-12-26 ENCOUNTER — Other Ambulatory Visit: Payer: Self-pay | Admitting: Pulmonary Disease

## 2017-12-26 ENCOUNTER — Telehealth: Payer: Self-pay | Admitting: Pulmonary Disease

## 2017-12-26 MED ORDER — AMOXICILLIN-POT CLAVULANATE 875-125 MG PO TABS: 1.0000 | ORAL_TABLET | Freq: Two times a day (BID) | ORAL | 0 refills | Status: DC

## 2017-12-26 NOTE — Telephone Encounter (Signed)
Per SN- Augmentin 839m #14, take 1 PO BID.  Take probiotic, like align or eat yogurt while taking the Augmentin. [facility] [place] Called Nicholas BMayburyand explained prescription was sent to preferred pharmacy and the he needed to take probiotic or yogurt.  He stated understanding.  Nothing further at this time.

## 2017-12-26 NOTE — Telephone Encounter (Signed)
Spoke with patient. He stated that he has been having a runny nose and productive cough for the past 3 days. Nose discharge is green as well as his phlegm. Denies any fever, SOB or chest pains.   He wanted to see if SN could prescribe anything for him.   He wishes to use [facility] on [address] In [place].   SN, please advise. Thanks!

## 2018-02-27 ENCOUNTER — Other Ambulatory Visit: Payer: Self-pay | Admitting: Pulmonary Disease

## 2018-03-25 ENCOUNTER — Telehealth: Payer: Self-pay | Admitting: Pulmonary Disease

## 2018-03-25 NOTE — Telephone Encounter (Signed)
Attempted to call pt. I did not receive an answer. I have left a message for pt to return our call.  

## 2018-03-26 MED ORDER — DILTIAZEM HCL ER 240 MG PO CP24: 240.0000 mg | ORAL_CAPSULE | Freq: Every day | ORAL | 3 refills | Status: DC

## 2018-03-26 NOTE — Telephone Encounter (Signed)
Called spoke with patient who is requesting a refill on his Diltiazem 246m Per patient he was told my his pharmacy that our office would not fill the 2432mbut will fill 12096mPer chart in the 11.12.18 phone note Dilitazem was increased to the 240m32mhitGeorjean ModeA  9:33 AM  Note   Per SN it is okay to increase the dose to 120mg85mdiltiazem medication. He recommends to use the generic medication of dilitiazem which is cardizem 240mg.6m QD #30 or #90 with refills. Per SN he requests a one month follow up and than as needed afterwards. Routing this message back to triage.      However, the 2.11.19 office visit, the Diltiazem was decreased back to 120mg: 35mructions  Return in about 6 months (around 05/10/2018).  Today we updated your med list in our EPIC system...    Continue your current medications the same...   We wrote a new prescription for TRAMADOL 50mg ta66m take one tab up to 3 times daily as needed for the arthritis pain (HINT: take it with an extra-strength Tylenol to potentiate it's affect)...   Call for any questions...   Let's plan a follow up visit in 19mo w/ f83mong labs etc...      Dr Nadel pleLenna Gilforddvise which Diltiazem strength you would prefer patient to [facility]

## 2018-03-26 NOTE — Telephone Encounter (Signed)
Per SN- Cardizem (generic) Diltiazem ER 251m, #90, 3 refills.  [place]   Patient notified of prescription and stated understanding. Nothing further needed at this time.

## 2018-05-11 ENCOUNTER — Ambulatory Visit: Payer: Medicare Other | Admitting: Pulmonary Disease

## 2018-05-11 ENCOUNTER — Ambulatory Visit (INDEPENDENT_AMBULATORY_CARE_PROVIDER_SITE_OTHER)
Admission: RE | Admit: 2018-05-11 | Discharge: 2018-05-11 | Disposition: A | Discharge status: Home / Self Care | Payer: Medicare Other | Source: Ambulatory Visit | Attending: Pulmonary Disease | Admitting: Pulmonary Disease

## 2018-05-11 ENCOUNTER — Encounter: Payer: Self-pay | Admitting: Pulmonary Disease

## 2018-05-11 VITALS — BP 134/70 | HR 60 | Temp 97.9°F | Ht 72.0 in | Wt 202.6 lb

## 2018-05-11 DIAGNOSIS — I10 Essential (primary) hypertension: Secondary | ICD-10-CM | POA: Diagnosis not present

## 2018-05-11 DIAGNOSIS — R9389 Abnormal findings on diagnostic imaging of other specified body structures: Secondary | ICD-10-CM

## 2018-05-11 DIAGNOSIS — I471 Supraventricular tachycardia: Secondary | ICD-10-CM

## 2018-05-11 DIAGNOSIS — E785 Hyperlipidemia, unspecified: Secondary | ICD-10-CM | POA: Diagnosis not present

## 2018-05-11 DIAGNOSIS — K5730 Diverticulosis of large intestine without perforation or abscess without bleeding: Secondary | ICD-10-CM

## 2018-05-11 DIAGNOSIS — M1711 Unilateral primary osteoarthritis, right knee: Secondary | ICD-10-CM

## 2018-05-11 DIAGNOSIS — C61 Malignant neoplasm of prostate: Secondary | ICD-10-CM

## 2018-05-11 NOTE — Patient Instructions (Addendum)
Today we updated your med list in our EPIC system...    Continue your current medications the same...  Today we did a follow up CXR... Please return to our lab in the AM for your FASTING blood work...    We will contact you w/ the results when available...   For your nasal congestion>>    Take an antihist like [id number] (OTC) daily in the AM...    And also take a nasal cortisone spray like FLONASE (Fluticasone) 2 sprays in each nostril at bedtime...    Ok to use the Vicks during the day if needed...  Call for any questions or if I can be of service in any way...  Eduard Clos, it has been my great pleasure to have been your doctor over these many years-- best wishes for a long & happy life!!!

## 2018-05-12 ENCOUNTER — Encounter: Payer: Self-pay | Admitting: Pulmonary Disease

## 2018-05-12 ENCOUNTER — Other Ambulatory Visit (INDEPENDENT_AMBULATORY_CARE_PROVIDER_SITE_OTHER): Payer: Medicare Other

## 2018-05-12 DIAGNOSIS — E785 Hyperlipidemia, unspecified: Secondary | ICD-10-CM | POA: Diagnosis not present

## 2018-05-12 DIAGNOSIS — I10 Essential (primary) hypertension: Secondary | ICD-10-CM

## 2018-05-12 LAB — CBC WITH DIFFERENTIAL/PLATELET
BASOS PCT: 1 % (ref 0.0–3.0)
Basophils Absolute: 0 10*3/uL (ref 0.0–0.1)
EOS ABS: 0.2 10*3/uL (ref 0.0–0.7)
EOS PCT: 3.5 % (ref 0.0–5.0)
HEMATOCRIT: 43 % (ref 39.0–52.0)
HEMOGLOBIN: 14.4 g/dL (ref 13.0–17.0)
LYMPHS PCT: 26.2 % (ref 12.0–46.0)
Lymphs Abs: 1.2 10*3/uL (ref 0.7–4.0)
MCHC: 33.4 g/dL (ref 30.0–36.0)
MCV: 94.8 fl (ref 78.0–100.0)
MONOS PCT: 10.3 % (ref 3.0–12.0)
Monocytes Absolute: 0.5 10*3/uL (ref 0.1–1.0)
Neutro Abs: 2.7 10*3/uL (ref 1.4–7.7)
Neutrophils Relative %: 59 % (ref 43.0–77.0)
Platelets: 180 10*3/uL (ref 150.0–400.0)
RBC: 4.54 Mil/uL (ref 4.22–5.81)
RDW: 13.3 % (ref 11.5–15.5)
WBC: 4.5 10*3/uL (ref 4.0–10.5)

## 2018-05-12 LAB — COMPREHENSIVE METABOLIC PANEL
ALBUMIN: 3.8 g/dL (ref 3.5–5.2)
ALT: 14 U/L (ref 0–53)
AST: 16 U/L (ref 0–37)
Alkaline Phosphatase: 66 U/L (ref 39–117)
BUN: 18 mg/dL (ref 6–23)
CHLORIDE: 106 meq/L (ref 96–112)
CO2: 28 mEq/L (ref 19–32)
Calcium: 8.7 mg/dL (ref 8.4–10.5)
Creatinine, Ser: 0.92 mg/dL (ref 0.40–1.50)
GFR: 85.23 mL/min (ref >60.00)
Glucose, Bld: 93 mg/dL (ref 70–99)
POTASSIUM: 4.4 meq/L (ref 3.5–5.1)
SODIUM: 139 meq/L (ref 135–145)
Total Bilirubin: 0.5 mg/dL (ref 0.2–1.2)
Total Protein: 6.2 g/dL (ref 6.0–8.3)

## 2018-05-12 LAB — LIPID PANEL
CHOL/HDL RATIO: 3
Cholesterol: 147 mg/dL (ref 0–200)
HDL: 48.1 mg/dL (ref >39.00)
LDL CALC: 90 mg/dL (ref 0–99)
NonHDL: 98.93
Triglycerides: 44 mg/dL (ref 0.0–149.0)
VLDL: 8.8 mg/dL (ref 0.0–40.0)

## 2018-05-12 LAB — TSH: TSH: 1.55 u[IU]/mL (ref 0.35–4.50)

## 2018-05-12 NOTE — Progress Notes (Signed)
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 by DrGrapey & DrManning...  Other medical problems as noted...     Plan:     Patient's Medications  New Prescriptions   No medications on file  Previous Medications   DILTIAZEM (DILACOR XR) 240 MG 24 HR CAPSULE    Take 1 capsule (240 mg total) by mouth daily.   FISH OIL-OMEGA-3 FATTY ACIDS 1000 MG CAPSULE    Take 2 g by mouth daily.   MULTIPLE VITAMINS-MINERALS (MULTIVITAMIN & MINERAL PO)    Take 1 tablet by mouth daily.   PSYLLIUM (METAMUCIL) 58.6 % POWDER    Take 1 packet by mouth daily.   SILDENAFIL (VIAGRA) 100 MG TABLET    Take 1 tablet (100 mg total) by mouth daily as needed for erectile dysfunction.   TAMSULOSIN (FLOMAX) 0.4 MG CAPS CAPSULE    Take 1 capsule (0.4 mg total) by mouth daily after supper.   TRAMADOL (ULTRAM) 50 MG TABLET    Take 1 tablet (50 mg total) by mouth 3 (three) times daily as needed.  Modified Medications   No medications on file  Discontinued Medications   AMOXICILLIN-CLAVULANATE (AUGMENTIN) 875-125 MG TABLET    Take 1 tablet by mouth 2 (two) times daily.

## 2018-06-19 ENCOUNTER — Other Ambulatory Visit: Payer: Self-pay | Admitting: *Deleted

## 2018-06-19 DIAGNOSIS — C61 Malignant neoplasm of prostate: Secondary | ICD-10-CM

## 2018-06-19 MED ORDER — TAMSULOSIN HCL 0.4 MG PO CAPS: 0.4000 mg | ORAL_CAPSULE | Freq: Every day | ORAL | 6 refills | Status: DC

## 2018-10-30 ENCOUNTER — Ambulatory Visit (INDEPENDENT_AMBULATORY_CARE_PROVIDER_SITE_OTHER): Payer: Medicare Other | Admitting: Physician Assistant

## 2018-10-30 ENCOUNTER — Encounter: Payer: Self-pay | Admitting: Physician Assistant

## 2018-10-30 VITALS — BP 128/83 | HR 67 | Wt 203.0 lb

## 2018-10-30 DIAGNOSIS — Z9889 Other specified postprocedural states: Secondary | ICD-10-CM

## 2018-10-30 DIAGNOSIS — Z7689 Persons encountering health services in other specified circumstances: Secondary | ICD-10-CM

## 2018-10-30 DIAGNOSIS — Z8546 Personal history of malignant neoplasm of prostate: Secondary | ICD-10-CM | POA: Diagnosis not present

## 2018-10-30 DIAGNOSIS — Z85828 Personal history of other malignant neoplasm of skin: Secondary | ICD-10-CM | POA: Insufficient documentation

## 2018-10-30 DIAGNOSIS — Z7189 Other specified counseling: Secondary | ICD-10-CM | POA: Insufficient documentation

## 2018-10-30 DIAGNOSIS — R911 Solitary pulmonary nodule: Secondary | ICD-10-CM | POA: Insufficient documentation

## 2018-10-30 DIAGNOSIS — I10 Essential (primary) hypertension: Secondary | ICD-10-CM | POA: Insufficient documentation

## 2018-10-30 DIAGNOSIS — Z8679 Personal history of other diseases of the circulatory system: Secondary | ICD-10-CM | POA: Insufficient documentation

## 2018-10-30 NOTE — Patient Instructions (Addendum)
1. Schedule annual head-to-toe skin exam with your Dermatologist 2. Keep your 6 month follow-up with Dr. Gloriann Loan for prostate cancer survivorship 3. Consult an attorney or Legal Zoom to make Advanced Directives 4. Review pink MOLST form. We can complete it together at your annual physical  Preventive Care 62 Years and Older, Male Preventive care refers to lifestyle choices and visits with your health care provider that can promote health and wellness. What does preventive care include?   A yearly physical exam. This is also called an annual well check.  Dental exams once or twice a year.  Routine eye exams. Ask your health care provider how often you should have your eyes checked.  Personal lifestyle choices, including: ? Daily care of your teeth and gums. ? Regular physical activity. ? Eating a healthy diet. ? Avoiding tobacco and drug use. ? Limiting alcohol use. ? Practicing safe sex. ? Taking low doses of aspirin every day. ? Taking vitamin and mineral supplements as recommended by your health care provider. What happens during an annual well check? The services and screenings done by your health care provider during your annual well check will depend on your age, overall health, lifestyle risk factors, and family history of disease. Counseling Your health care provider may ask you questions about your:  Alcohol use.  Tobacco use.  Drug use.  Emotional well-being.  Home and relationship well-being.  Sexual activity.  Eating habits.  History of falls.  Memory and ability to understand (cognition).  Work and work Statistician. Screening You may have the following tests or measurements:  Height, weight, and BMI.  Blood pressure.  Lipid and cholesterol levels. These may be checked every 5 years, or more frequently if you are over 50 years old.  Skin check.  Lung cancer screening. You may have this screening every year starting at age 93 if you have a  30-pack-year history of smoking and currently smoke or have quit within the past 15 years.  Colorectal cancer screening. All adults should have this screening starting at age 31 and continuing until age 30. You will have tests every 1-10 years, depending on your results and the type of screening test. People at increased risk should start screening at an earlier age. Screening tests may include: ? Guaiac-based fecal occult blood testing. ? Fecal immunochemical test (FIT). ? Stool DNA test. ? Virtual colonoscopy. ? Sigmoidoscopy. During this test, a flexible tube with a tiny camera (sigmoidoscope) is used to examine your rectum and lower colon. The sigmoidoscope is inserted through your anus into your rectum and lower colon. ? Colonoscopy. During this test, a long, thin, flexible tube with a tiny camera (colonoscope) is used to examine your entire colon and rectum.  Prostate cancer screening. Recommendations will vary depending on your family history and other risks.  Hepatitis C blood test.  Hepatitis B blood test.  Sexually transmitted disease (STD) testing.  Diabetes screening. This is done by checking your blood sugar (glucose) after you have not eaten for a while (fasting). You may have this done every 1-3 years.  Abdominal aortic aneurysm (AAA) screening. You may need this if you are a current or former smoker.  Osteoporosis. You may be screened starting at age 98 if you are at high risk. Talk with your health care provider about your test results, treatment options, and if necessary, the need for more tests. Vaccines Your health care provider may recommend certain vaccines, such as:  Influenza vaccine. This is recommended every year.  Tetanus, diphtheria, and acellular pertussis (Tdap, Td) vaccine. You may need a Td booster every 10 years.  Varicella vaccine. You may need this if you have not been vaccinated.  Zoster vaccine. You may need this after age 67.  Measles, mumps,  and rubella (MMR) vaccine. You may need at least one dose of MMR if you were born in 1957 or later. You may also need a second dose.  Pneumococcal 13-valent conjugate (PCV13) vaccine. One dose is recommended after age 73.  Pneumococcal polysaccharide (PPSV23) vaccine. One dose is recommended after age 42.  Meningococcal vaccine. You may need this if you have certain conditions.  Hepatitis A vaccine. You may need this if you have certain conditions or if you travel or work in places where you may be exposed to hepatitis A.  Hepatitis B vaccine. You may need this if you have certain conditions or if you travel or work in places where you may be exposed to hepatitis B.  Haemophilus influenzae type b (Hib) vaccine. You may need this if you have certain risk factors. Talk to your health care provider about which screenings and vaccines you need and how often you need them. This information is not intended to replace advice given to you by your health care provider. Make sure you discuss any questions you have with your health care provider. Document Released: 10/13/2015 Document Revised: 11/06/2017 Document Reviewed: 07/18/2015 Elsevier Interactive Patient Education  2019 [employer].
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
## 2018-12-21 ENCOUNTER — Ambulatory Visit: Payer: Medicare Other

## 2018-12-21 ENCOUNTER — Other Ambulatory Visit: Payer: Self-pay

## 2019-01-29 ENCOUNTER — Encounter: Payer: Medicare Other | Admitting: Physician Assistant

## 2019-02-10 ENCOUNTER — Other Ambulatory Visit: Payer: Self-pay | Admitting: Pulmonary Disease

## 2019-02-10 DIAGNOSIS — C61 Malignant neoplasm of prostate: Secondary | ICD-10-CM

## 2019-02-24 ENCOUNTER — Encounter: Payer: Medicare Other | Admitting: Physician Assistant

## 2019-03-16 ENCOUNTER — Ambulatory Visit (INDEPENDENT_AMBULATORY_CARE_PROVIDER_SITE_OTHER): Payer: Medicare Other | Admitting: Physician Assistant

## 2019-03-16 ENCOUNTER — Encounter: Payer: Self-pay | Admitting: Physician Assistant

## 2019-03-16 VITALS — BP 119/66 | HR 65 | Temp 98.1°F | Wt 207.0 lb

## 2019-03-16 DIAGNOSIS — Z0000 Encounter for general adult medical examination without abnormal findings: Secondary | ICD-10-CM | POA: Diagnosis not present

## 2019-03-16 DIAGNOSIS — Z9189 Other specified personal risk factors, not elsewhere classified: Secondary | ICD-10-CM | POA: Diagnosis not present

## 2019-03-16 DIAGNOSIS — Z23 Encounter for immunization: Secondary | ICD-10-CM

## 2019-03-16 DIAGNOSIS — I10 Essential (primary) hypertension: Secondary | ICD-10-CM

## 2019-03-16 DIAGNOSIS — E785 Hyperlipidemia, unspecified: Secondary | ICD-10-CM

## 2019-03-16 DIAGNOSIS — R351 Nocturia: Secondary | ICD-10-CM

## 2019-03-16 DIAGNOSIS — Z8679 Personal history of other diseases of the circulatory system: Secondary | ICD-10-CM | POA: Diagnosis not present

## 2019-03-16 DIAGNOSIS — Z8546 Personal history of malignant neoplasm of prostate: Secondary | ICD-10-CM

## 2019-03-16 MED ORDER — ZOSTER VAC RECOMB ADJUVANTED 50 MCG/0.5ML IM SUSR: 0.5000 mL | Freq: Once | INTRAMUSCULAR | 0 refills | Status: AC

## 2019-03-16 MED ORDER — DILTIAZEM HCL ER 240 MG PO CP24: 240.0000 mg | ORAL_CAPSULE | Freq: Every day | ORAL | 1 refills | Status: DC

## 2019-03-16 MED ORDER — TAMSULOSIN HCL 0.4 MG PO CAPS: 0.4000 mg | ORAL_CAPSULE | Freq: Every day | ORAL | 1 refills | Status: DC

## 2019-03-16 NOTE — Patient Instructions (Addendum)
[name] , Thank you for taking time to come for your Medicare Wellness Visit. I appreciate your ongoing commitment to your health goals. Please review the following plan we discussed and let me know if I can assist you in the future.   These are the goals we discussed: Goals   None     This is a list of the screening recommended for you and due dates:  Health Maintenance  Topic Date Due  . Flu Shot  05/01/2019  . Colon Cancer Screening  07/24/2020  . Tetanus Vaccine  11/06/2024  . Pneumonia vaccines  Completed     Preventive Care 57 Years and Older, Male Preventive care refers to lifestyle choices and visits with your health care provider that can promote health and wellness. What does preventive care include?   A yearly physical exam. This is also called an annual well check.  Dental exams once or twice a year.  Routine eye exams. Ask your health care provider how often you should have your eyes checked.  Personal lifestyle choices, including: ? Daily care of your teeth and gums. ? Regular physical activity. ? Eating a healthy diet. ? Avoiding tobacco and drug use. ? Limiting alcohol use. ? Practicing safe sex. ? Taking low doses of aspirin every day. ? Taking vitamin and mineral supplements as recommended by your health care provider. What happens during an annual well check? The services and screenings done by your health care provider during your annual well check will depend on your age, overall health, lifestyle risk factors, and family history of disease. Counseling Your health care provider may ask you questions about your:  Alcohol use.  Tobacco use.  Drug use.  Emotional well-being.  Home and relationship well-being.  Sexual activity.  Eating habits.  History of falls.  Memory and ability to understand (cognition).  Work and work Statistician. Screening You may have the following tests or measurements:  Height, weight, and BMI.  Blood  pressure.  Lipid and cholesterol levels. These may be checked every 5 years, or more frequently if you are over 4 years old.  Skin check.  Lung cancer screening. You may have this screening every year starting at age 70 if you have a 30-pack-year history of smoking and currently smoke or have quit within the past 15 years.  Colorectal cancer screening. All adults should have this screening starting at age 73 and continuing until age 39. You will have tests every 1-10 years, depending on your results and the type of screening test. People at increased risk should start screening at an earlier age. Screening tests may include: ? Guaiac-based fecal occult blood testing. ? Fecal immunochemical test (FIT). ? Stool DNA test. ? Virtual colonoscopy. ? Sigmoidoscopy. During this test, a flexible tube with a tiny camera (sigmoidoscope) is used to examine your rectum and lower colon. The sigmoidoscope is inserted through your anus into your rectum and lower colon. ? Colonoscopy. During this test, a long, thin, flexible tube with a tiny camera (colonoscope) is used to examine your entire colon and rectum.  Prostate cancer screening. Recommendations will vary depending on your family history and other risks.  Hepatitis C blood test.  Hepatitis B blood test.  Sexually transmitted disease (STD) testing.  Diabetes screening. This is done by checking your blood sugar (glucose) after you have not eaten for a while (fasting). You may have this done every 1-3 years.  Abdominal aortic aneurysm (AAA) screening. You may need this if you are  a current or former smoker.  Osteoporosis. You may be screened starting at age 37 if you are at high risk. Talk with your health care provider about your test results, treatment options, and if necessary, the need for more tests. Vaccines Your health care provider may recommend certain vaccines, such as:  Influenza vaccine. This is recommended every year.  Tetanus,  diphtheria, and acellular pertussis (Tdap, Td) vaccine. You may need a Td booster every 10 years.  Varicella vaccine. You may need this if you have not been vaccinated.  Zoster vaccine. You may need this after age 72.  Measles, mumps, and rubella (MMR) vaccine. You may need at least one dose of MMR if you were born in 1957 or later. You may also need a second dose.  Pneumococcal 13-valent conjugate (PCV13) vaccine. One dose is recommended after age 21.  Pneumococcal polysaccharide (PPSV23) vaccine. One dose is recommended after age 4.  Meningococcal vaccine. You may need this if you have certain conditions.  Hepatitis A vaccine. You may need this if you have certain conditions or if you travel or work in places where you may be exposed to hepatitis A.  Hepatitis B vaccine. You may need this if you have certain conditions or if you travel or work in places where you may be exposed to hepatitis B.  Haemophilus influenzae type b (Hib) vaccine. You may need this if you have certain risk factors. Talk to your health care provider about which screenings and vaccines you need and how often you need them. This information is not intended to replace advice given to you by your health care provider. Make sure you discuss any questions you have with your health care provider. Document Released: 10/13/2015 Document Revised: 11/06/2017 Document Reviewed: 07/18/2015 Elsevier Interactive Patient Education  2019 [employer].
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
## 2019-03-17 LAB — COMPLETE METABOLIC PANEL WITH GFR
AG Ratio: 1.8 (calc) (ref 1.0–2.5)
ALT: 15 U/L (ref 9–46)
AST: 19 U/L (ref 10–35)
Albumin: 4 g/dL (ref 3.6–5.1)
Alkaline phosphatase (APISO): 76 U/L (ref 35–144)
BUN: 15 mg/dL (ref 7–25)
CO2: 28 mmol/L (ref 20–32)
Calcium: 9 mg/dL (ref 8.6–10.3)
Chloride: 106 mmol/L (ref 98–110)
Creat: 0.83 mg/dL (ref 0.70–1.18)
GFR, Est African American: 100 mL/min/{1.73_m2} (ref >=60)
GFR, Est Non African American: 86 mL/min/{1.73_m2} (ref >=60)
Globulin: 2.2 g/dL (calc) (ref 1.9–3.7)
Glucose, Bld: 92 mg/dL (ref 65–99)
Potassium: 4.7 mmol/L (ref 3.5–5.3)
Sodium: 142 mmol/L (ref 135–146)
Total Bilirubin: 0.5 mg/dL (ref 0.2–1.2)
Total Protein: 6.2 g/dL (ref 6.1–8.1)

## 2019-03-17 LAB — CBC
HCT: 42.5 % (ref 38.5–50.0)
Hemoglobin: 14.7 g/dL (ref 13.2–17.1)
MCH: 32.1 pg (ref 27.0–33.0)
MCHC: 34.6 g/dL (ref 32.0–36.0)
MCV: 92.8 fL (ref 80.0–100.0)
MPV: 9.6 fL (ref 7.5–12.5)
Platelets: 221 10*3/uL (ref 140–400)
RBC: 4.58 10*6/uL (ref 4.20–5.80)
RDW: 12 % (ref 11.0–15.0)
WBC: 5.3 10*3/uL (ref 3.8–10.8)

## 2019-03-17 LAB — LIPID PANEL W/REFLEX DIRECT LDL
Cholesterol: 165 mg/dL (ref <200)
HDL: 54 mg/dL (ref >=40)
LDL Cholesterol (Calc): 97 mg/dL (calc)
Non-HDL Cholesterol (Calc): 111 mg/dL (calc) (ref <130)
Total CHOL/HDL Ratio: 3.1 (calc) (ref <5.0)
Triglycerides: 58 mg/dL (ref <150)

## 2019-03-19 ENCOUNTER — Encounter: Payer: Self-pay | Admitting: Physician Assistant

## 2019-03-19 DIAGNOSIS — I129 Hypertensive chronic kidney disease with stage 1 through stage 4 chronic kidney disease, or unspecified chronic kidney disease: Secondary | ICD-10-CM

## 2019-03-19 DIAGNOSIS — N182 Chronic kidney disease, stage 2 (mild): Secondary | ICD-10-CM

## 2019-03-19 HISTORY — DX: Hypertensive chronic kidney disease with stage 1 through stage 4 chronic kidney disease, or unspecified chronic kidney disease: I12.9

## 2019-03-19 HISTORY — DX: Chronic kidney disease, stage 2 (mild): N18.2

## 2019-08-09 ENCOUNTER — Ambulatory Visit (INDEPENDENT_AMBULATORY_CARE_PROVIDER_SITE_OTHER): Payer: Medicare Other

## 2019-08-09 ENCOUNTER — Ambulatory Visit (INDEPENDENT_AMBULATORY_CARE_PROVIDER_SITE_OTHER): Payer: Medicare Other | Admitting: Physician Assistant

## 2019-08-09 ENCOUNTER — Encounter: Payer: Self-pay | Admitting: Physician Assistant

## 2019-08-09 ENCOUNTER — Other Ambulatory Visit: Payer: Self-pay

## 2019-08-09 VITALS — BP 155/85 | HR 72 | Ht 73.0 in | Wt 212.0 lb

## 2019-08-09 DIAGNOSIS — Z8546 Personal history of malignant neoplasm of prostate: Secondary | ICD-10-CM | POA: Diagnosis not present

## 2019-08-09 DIAGNOSIS — R152 Fecal urgency: Secondary | ICD-10-CM | POA: Diagnosis not present

## 2019-08-09 DIAGNOSIS — K921 Melena: Secondary | ICD-10-CM

## 2019-08-09 DIAGNOSIS — R159 Full incontinence of feces: Secondary | ICD-10-CM | POA: Diagnosis not present

## 2019-08-09 MED ORDER — DICYCLOMINE HCL 10 MG PO CAPS: 10.0000 mg | ORAL_CAPSULE | Freq: Two times a day (BID) | ORAL | 1 refills | Status: DC
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
## 2019-08-09 NOTE — Progress Notes (Signed)
You do have moderate stool throughout the colon. I would do miralax 1 capful twice a day until having good full bowel movements. Maybe do this on a day where you will be home. Consider 1-3 days of miralax.   Possible small left kidney stones but not obstructive and not anything needs to be done.

## 2019-08-09 NOTE — Patient Instructions (Addendum)
cultrelle probiotic daily.  Bentyl twice a day before meals.  Will get colonoscopy.

## 2019-08-10 DIAGNOSIS — R152 Fecal urgency: Secondary | ICD-10-CM | POA: Insufficient documentation

## 2019-08-10 DIAGNOSIS — R159 Full incontinence of feces: Secondary | ICD-10-CM | POA: Insufficient documentation

## 2019-08-10 DIAGNOSIS — K921 Melena: Secondary | ICD-10-CM | POA: Insufficient documentation

## 2019-08-10 LAB — BASIC METABOLIC PANEL WITH GFR
BUN: 15 mg/dL (ref 7–25)
CO2: 27 mmol/L (ref 20–32)
Calcium: 9.1 mg/dL (ref 8.6–10.3)
Chloride: 106 mmol/L (ref 98–110)
Creat: 0.9 mg/dL (ref 0.70–1.18)
GFR, Est African American: 96 mL/min/{1.73_m2} (ref >=60)
GFR, Est Non African American: 83 mL/min/{1.73_m2} (ref >=60)
Glucose, Bld: 96 mg/dL (ref 65–99)
Potassium: 4.2 mmol/L (ref 3.5–5.3)
Sodium: 139 mmol/L (ref 135–146)

## 2019-08-10 LAB — CBC WITH DIFFERENTIAL/PLATELET
Absolute Monocytes: 627 cells/uL (ref 200–950)
Basophils Absolute: 53 cells/uL (ref 0–200)
Basophils Relative: 0.8 %
Eosinophils Absolute: 152 cells/uL (ref 15–500)
Eosinophils Relative: 2.3 %
HCT: 44 % (ref 38.5–50.0)
Hemoglobin: 15.2 g/dL (ref 13.2–17.1)
Lymphs Abs: 1795 cells/uL (ref 850–3900)
MCH: 32.8 pg (ref 27.0–33.0)
MCHC: 34.5 g/dL (ref 32.0–36.0)
MCV: 94.8 fL (ref 80.0–100.0)
MPV: 9.7 fL (ref 7.5–12.5)
Monocytes Relative: 9.5 %
Neutro Abs: 3973 cells/uL (ref 1500–7800)
Neutrophils Relative %: 60.2 %
Platelets: 204 10*3/uL (ref 140–400)
RBC: 4.64 10*6/uL (ref 4.20–5.80)
RDW: 12.3 % (ref 11.0–15.0)
Total Lymphocyte: 27.2 %
WBC: 6.6 10*3/uL (ref 3.8–10.8)

## 2019-08-10 LAB — PSA: PSA: 0.8 ng/mL (ref <=4.0)

## 2019-08-10 NOTE — Progress Notes (Signed)
Call pt: WBC looks good. hgB looks good. Kidney, liver, glucose look good. PSA looks great.

## 2019-08-31 ENCOUNTER — Other Ambulatory Visit (INDEPENDENT_AMBULATORY_CARE_PROVIDER_SITE_OTHER): Payer: Medicare Other

## 2019-08-31 ENCOUNTER — Other Ambulatory Visit: Payer: Self-pay

## 2019-08-31 ENCOUNTER — Encounter: Payer: Self-pay | Admitting: Gastroenterology

## 2019-08-31 ENCOUNTER — Ambulatory Visit: Payer: Medicare Other | Admitting: Gastroenterology

## 2019-08-31 VITALS — BP 120/74 | HR 88 | Temp 98.2°F | Ht 71.5 in | Wt 209.2 lb

## 2019-08-31 DIAGNOSIS — K5730 Diverticulosis of large intestine without perforation or abscess without bleeding: Secondary | ICD-10-CM

## 2019-08-31 DIAGNOSIS — R197 Diarrhea, unspecified: Secondary | ICD-10-CM | POA: Diagnosis not present

## 2019-08-31 DIAGNOSIS — R152 Fecal urgency: Secondary | ICD-10-CM | POA: Diagnosis not present

## 2019-08-31 DIAGNOSIS — K625 Hemorrhage of anus and rectum: Secondary | ICD-10-CM

## 2019-08-31 DIAGNOSIS — R194 Change in bowel habit: Secondary | ICD-10-CM

## 2019-08-31 LAB — IGA: IgA: 143 mg/dL (ref 68–378)

## 2019-08-31 LAB — TSH: TSH: 3.18 u[IU]/mL (ref 0.35–4.50)

## 2019-08-31 MED ORDER — SUPREP BOWEL PREP KIT 17.5-3.13-1.6 GM/177ML PO SOLN: 1.0000 | ORAL | 0 refills | Status: DC

## 2019-08-31 NOTE — Patient Instructions (Addendum)
If you are age 76 or older, your body mass index should be between 23-30. Your Body mass index is 28.78 kg/m. If this is out of the aforementioned range listed, please consider follow up with your Primary Care Provider.  If you are age 50 or younger, your body mass index should be between 19-25. Your Body mass index is 28.78 kg/m. If this is out of the aformentioned range listed, please consider follow up with your Primary Care Provider.   You have been scheduled for an endoscopy and colonoscopy. Please follow the written instructions given to you at your visit today. Please pick up your prep supplies at the pharmacy within the next 1-3 days. If you use inhalers (even only as needed), please bring them with you on the day of your procedure. Your physician has requested that you go to www.startemmi.com and enter the access code given to you at your visit today. This web site gives a general overview about your procedure. However, you should still follow specific instructions given to you by our office regarding your preparation for the procedure.  Your provider has requested that you go to the basement level for lab work before leaving today. Press "B" on the elevator. The lab is located at the first door on the left as you exit the elevator.  A high fiber diet with plenty of fluids (up to 8 glasses of water daily) is suggested to relieve these symptoms.  Metamucil, 1 tablespoon once or twice daily can be used to keep bowels regular if needed.  If still having issues then consider fibercon - one pill daily.   Thank you for choosing me and [facility].  Dr. Rush Landmark
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
## 2019-09-01 ENCOUNTER — Encounter: Payer: Self-pay | Admitting: Gastroenterology

## 2019-09-01 ENCOUNTER — Ambulatory Visit (INDEPENDENT_AMBULATORY_CARE_PROVIDER_SITE_OTHER): Payer: Medicare Other

## 2019-09-01 ENCOUNTER — Other Ambulatory Visit: Payer: Self-pay | Admitting: Gastroenterology

## 2019-09-01 DIAGNOSIS — K625 Hemorrhage of anus and rectum: Secondary | ICD-10-CM | POA: Insufficient documentation

## 2019-09-01 DIAGNOSIS — Z1159 Encounter for screening for other viral diseases: Secondary | ICD-10-CM

## 2019-09-01 DIAGNOSIS — R197 Diarrhea, unspecified: Secondary | ICD-10-CM | POA: Insufficient documentation

## 2019-09-01 LAB — HEPATITIS C ANTIBODY
Hepatitis C Ab: NONREACTIVE
SIGNAL TO CUT-OFF: 0.01 (ref <1.00)

## 2019-09-01 LAB — TISSUE TRANSGLUTAMINASE, IGA: (tTG) Ab, IgA: 1 U/mL

## 2019-09-02 DIAGNOSIS — K5730 Diverticulosis of large intestine without perforation or abscess without bleeding: Secondary | ICD-10-CM | POA: Insufficient documentation

## 2019-09-02 DIAGNOSIS — R152 Fecal urgency: Secondary | ICD-10-CM | POA: Insufficient documentation

## 2019-09-02 LAB — SARS CORONAVIRUS 2 (TAT 6-24 HRS): SARS Coronavirus 2: NEGATIVE

## 2019-09-03 ENCOUNTER — Ambulatory Visit (AMBULATORY_SURGERY_CENTER): Payer: Medicare Other | Admitting: Gastroenterology

## 2019-09-03 ENCOUNTER — Encounter: Payer: Self-pay | Admitting: Gastroenterology

## 2019-09-03 ENCOUNTER — Other Ambulatory Visit: Payer: Self-pay

## 2019-09-03 VITALS — BP 126/83 | HR 75 | Temp 98.6°F | Resp 21 | Ht 71.0 in | Wt 209.0 lb

## 2019-09-03 DIAGNOSIS — K648 Other hemorrhoids: Secondary | ICD-10-CM

## 2019-09-03 DIAGNOSIS — K2100 Gastro-esophageal reflux disease with esophagitis, without bleeding: Secondary | ICD-10-CM | POA: Diagnosis not present

## 2019-09-03 DIAGNOSIS — K259 Gastric ulcer, unspecified as acute or chronic, without hemorrhage or perforation: Secondary | ICD-10-CM

## 2019-09-03 DIAGNOSIS — K2980 Duodenitis without bleeding: Secondary | ICD-10-CM | POA: Diagnosis not present

## 2019-09-03 DIAGNOSIS — K449 Diaphragmatic hernia without obstruction or gangrene: Secondary | ICD-10-CM | POA: Diagnosis not present

## 2019-09-03 DIAGNOSIS — K5289 Other specified noninfective gastroenteritis and colitis: Secondary | ICD-10-CM

## 2019-09-03 DIAGNOSIS — K5730 Diverticulosis of large intestine without perforation or abscess without bleeding: Secondary | ICD-10-CM

## 2019-09-03 DIAGNOSIS — R197 Diarrhea, unspecified: Secondary | ICD-10-CM

## 2019-09-03 DIAGNOSIS — K2970 Gastritis, unspecified, without bleeding: Secondary | ICD-10-CM

## 2019-09-03 MED ORDER — OMEPRAZOLE 40 MG PO CPDR: 40.0000 mg | DELAYED_RELEASE_CAPSULE | Freq: Every day | ORAL | 3 refills | Status: DC

## 2019-09-03 MED ORDER — SODIUM CHLORIDE 0.9 % IV SOLN: 500.0000 mL | INTRAVENOUS | Status: DC

## 2019-09-03 NOTE — Op Note (Signed)
[name] Patient Name: Nicholas Garza Procedure Date: 09/03/2019 1:31 PM MRN: [id number] Endoscopist: Justice Britain , MD Age: 78 Referring MD:  Date of Birth: [birth year] Gender: Male Account #: [id number] Procedure:                Upper GI endoscopy Indications:              Abdominal bloating, Diarrhea Medicines:                Monitored Anesthesia Care Procedure:                Pre-Anesthesia Assessment:                           - Prior to the procedure, a History and Physical                            was performed, and patient medications and                            allergies were reviewed. The patient's tolerance of                            previous anesthesia was also reviewed. The risks                            and benefits of the procedure and the sedation                            options and risks were discussed with the patient.                            All questions were answered, and informed consent                            was obtained. Prior Anticoagulants: The patient has                            taken no previous anticoagulant or antiplatelet                            agents except for aspirin. ASA Grade Assessment: II                            - A patient with mild systemic disease. After                            reviewing the risks and benefits, the patient was                            deemed in satisfactory condition to undergo the                            procedure.  After obtaining informed consent, the endoscope was                            passed under direct vision. Throughout the                            procedure, the patient's blood pressure, pulse, and                            oxygen saturations were monitored continuously. The                            Endoscope was introduced through the mouth, and                            advanced to the second part of duodenum. The upper                         GI endoscopy was accomplished without difficulty.                            The patient tolerated the procedure. Scope In: Scope Out: Findings:                 No gross lesions were noted in the proximal                            esophagus and in the mid esophagus.                           LA Grade C (one or more mucosal breaks continuous                            between tops of 2 or more mucosal folds, less than                            75% circumference) esophagitis with no bleeding was                            found in the distal esophagus and GE Junction.                           A 3 cm hiatal hernia was found. The proximal extent                            of the gastric folds (end of tubular esophagus) was                            37 cm from the incisors. The hiatal narrowing was                            39 cm from the incisors. The Z-line was 36 cm from  the incisors.                           Multiple dispersed small erosions with no bleeding                            and no stigmata of recent bleeding were found in                            the cardia, in the gastric fundus, in the gastric                            body and in the gastric antrum.                           Patchy mildly erythematous mucosa without bleeding                            was found in the entire examined stomach.                           No other gross lesions were noted in the entire                            examined stomach. Biopsies were taken with a cold                            forceps for histology and Helicobacter pylori                            testing.                           No gross lesions were noted in the duodenal bulb,                            in the first portion of the duodenum and in the                            second portion of the duodenum. Biopsies for                            histology were taken with  a cold forceps for                            evaluation of celiac disease and enteropathy. Complications:            No immediate complications. Estimated Blood Loss:     Estimated blood loss was minimal. Impression:               - No gross lesions in esophagus proximally. LA                            Grade C esophagitis with no bleeding distally and  at [employer].                           - 3 cm hiatal hernia.                           - Erosive gastropathy with no bleeding and no                            stigmata of recent bleeding. Erythematous mucosa in                            the stomach. Biopsied.                           - No gross lesions in the duodenal bulb, in the                            first portion of the duodenum and in the second                            portion of the duodenum. Biopsied. Recommendation:           - Proceed to scheduled colonoscopy.                           - Await pathology results.                           - Start Omeprazole or Protonix 40 mg daily for next                            70-month with plan to decrease to 20 mg thereafter.                           - Minimize NSAID use.                           - Repeat upper endoscopy in 2-2.5 months to check                            healing of esophagitis and additionally the                            gastritis and erosions.                           - The findings and recommendations were discussed                            with the patient. GJustice Britain MD 09/03/2019 2:40:11 PM

## 2019-09-03 NOTE — Progress Notes (Signed)
PT taken to PACU. Monitors in place. VSS. Report given to RN. 

## 2019-09-03 NOTE — Patient Instructions (Addendum)
Please see handouts given to you on Esophagitis, Gastritis, Hemorrhoids and Diverticulosis. You have a new prescription to be picked up at your pharmacy for Omeprazole 40 mg daily. THank you for letting us take care of your healthcare needs today.    YOU HAD AN ENDOSCOPIC PROCEDURE TODAY AT [facility]:   Refer to the procedure report that was given to you for any specific questions about what was found during the examination.  If the procedure report does not answer your questions, please call your gastroenterologist to clarify.  If you requested that your care partner not be given the details of your procedure findings, then the procedure report has been included in a sealed envelope for you to review at your convenience later.  YOU SHOULD EXPECT: Some feelings of bloating in the abdomen. Passage of more gas than usual.  Walking can help get rid of the air that was put into your GI tract during the procedure and reduce the bloating. If you had a lower endoscopy (such as a colonoscopy or flexible sigmoidoscopy) you may notice spotting of blood in your stool or on the toilet paper. If you underwent a bowel prep for your procedure, you may not have a normal bowel movement for a few days.  Please Note:  You might notice some irritation and congestion in your nose or some drainage.  This is from the oxygen used during your procedure.  There is no need for concern and it should clear up in a day or so.  SYMPTOMS TO REPORT IMMEDIATELY:   Following lower endoscopy (colonoscopy or flexible sigmoidoscopy):  Excessive amounts of blood in the stool  Significant tenderness or worsening of abdominal pains  Swelling of the abdomen that is new, acute  Fever of 100F or higher   Following upper endoscopy (EGD)  Vomiting of blood or coffee ground material  New chest pain or pain under the shoulder blades  Painful or persistently difficult swallowing  New shortness of breath  Fever of  100F or higher  Black, tarry-looking stools  For urgent or emergent issues, a gastroenterologist can be reached at any hour by calling [phone].   DIET:  We do recommend a small meal at first, but then you may proceed to your regular diet.  Drink plenty of fluids but you should avoid alcoholic beverages for 24 hours.  ACTIVITY:  You should plan to take it easy for the rest of today and you should NOT DRIVE or use heavy machinery until tomorrow (because of the sedation medicines used during the test).    FOLLOW UP: Our staff will call the number listed on your records 48-72 hours following your procedure to check on you and address any questions or concerns that you may have regarding the information given to you following your procedure. If we do not reach you, we will leave a message.  We will attempt to reach you two times.  During this call, we will ask if you have developed any symptoms of COVID 19. If you develop any symptoms (ie: fever, flu-like symptoms, shortness of breath, cough etc.) before then, please call [phone].  If you test positive for Covid 19 in the 2 weeks post procedure, please call and report this information to [place].    If any biopsies were taken you will be contacted by phone or by letter within the next 1-3 weeks.  Please call us at [phone] if you have not heard about the biopsies in 3  weeks.    SIGNATURES/CONFIDENTIALITY: You and/or your care partner have signed paperwork which will be entered into your electronic medical record.  These signatures attest to the fact that that the information above on your After Visit Summary has been reviewed and is understood.  Full responsibility of the confidentiality of this discharge information lies with you and/or your care-partner.

## 2019-09-03 NOTE — Progress Notes (Signed)
Temp LC  vs [place]

## 2019-09-03 NOTE — Progress Notes (Signed)
Schedule is not out yet for 2-2.5 months to schedule repeat EGD. Will need scheduled with patient.

## 2019-09-03 NOTE — Op Note (Signed)
[facility] Patient Name: Nicholas Garza Procedure Date: 09/03/2019 1:31 PM MRN: [id number] Endoscopist: Justice Britain , MD Age: 78 Referring MD:  Date of Birth: 11-06-1940 Gender: Male Account #: [id number] Procedure:                Colonoscopy Indications:              Diarrhea Medicines:                Monitored Anesthesia Care Procedure:                Pre-Anesthesia Assessment:                           - Prior to the procedure, a History and Physical                            was performed, and patient medications and                            allergies were reviewed. The patient's tolerance of                            previous anesthesia was also reviewed. The risks                            and benefits of the procedure and the sedation                            options and risks were discussed with the patient.                            All questions were answered, and informed consent                            was obtained. Prior Anticoagulants: The patient has                            taken no previous anticoagulant or antiplatelet                            agents except for aspirin. ASA Grade Assessment: II                            - A patient with mild systemic disease. After                            reviewing the risks and benefits, the patient was                            deemed in satisfactory condition to undergo the                            procedure.  After obtaining informed consent, the colonoscope                            was passed under direct vision. Throughout the                            procedure, the patient's blood pressure, pulse, and                            oxygen saturations were monitored continuously. The                            Colonoscope was introduced through the anus and                            advanced to the the cecum, identified by                            appendiceal  orifice and ileocecal valve. The                            colonoscopy was performed without difficulty. The                            patient tolerated the procedure. The quality of the                            bowel preparation was adequate. The ileocecal                            valve, appendiceal orifice, and rectum were                            photographed. Scope In: 2:12:04 PM Scope Out: 2:31:50 PM Scope Withdrawal Time: 0 hours 15 minutes 34 seconds  Total Procedure Duration: 0 hours 19 minutes 46 seconds  Findings:                 The digital rectal exam findings include                            hemorrhoids. Pertinent negatives include no                            palpable rectal lesions.                           There was a small lipoma, 15 mm in diameter, in the                            ascending colon.                           Normal mucosa was found in the transverse colon, at  the hepatic flexure, in the ascending colon and in                            the cecum. Biopsies were taken with a cold forceps                            for histology to rule out chronic colitis.                           Diffuse severe mucosal changes characterized by                            altered vascularity, congestion (edema), erythema,                            friability and granularity were found in the                            rectum, in the recto-sigmoid colon, in the sigmoid                            colon, in the mid descending colon and in the                            distal descending colon. Biopsies were taken with a                            cold forceps for histology from the left colon.                            Biopsies were taken with a cold forceps for                            histology from the rectum.                           Many small and large-mouthed diverticula were found                            in the  recto-sigmoid colon, sigmoid colon,                            descending colon and transverse colon.                           Non-bleeding non-thrombosed external and internal                            hemorrhoids were found during retroflexion, during                            perianal exam and during digital exam. The  hemorrhoids were Grade II (internal hemorrhoids                            that prolapse but reduce spontaneously). Complications:            No immediate complications. Estimated Blood Loss:     Estimated blood loss was minimal. Impression:               - Hemorrhoids found on digital rectal exam.                           - Small lipoma in the ascending colon.                           - Normal mucosa in the transverse colon, at the                            hepatic flexure, in the ascending colon and in the                            cecum. Biopsied.                           - Diffuse severe mucosal changes were found in the                            rectum, in the recto-sigmoid colon, in the sigmoid                            colon, in the mid descending colon and in the                            distal descending colon secondary to left-sided                            colitis. Biopsied.                           - Non-bleeding non-thrombosed external and internal                            hemorrhoids.                           - Diverticulosis in the recto-sigmoid colon, in the                            sigmoid colon, in the descending colon and in the                            transverse colon.                           - Hemorrhoids found on digital rectal exam. Recommendation:           - The patient will be observed post-procedure,  until all discharge criteria are met.                           - Discharge patient to home.                           - Patient has a contact number available for                             emergencies. The signs and symptoms of potential                            delayed complications were discussed with the                            patient. Return to normal activities tomorrow.                            Written discharge instructions were provided to the                            patient.                           - High fiber diet.                           - Fibercon daily.                           - Continue present medications.                           - Minimize NSAID use as able.                           - Follow up pathology.                           - Will likely consider Topical Mesalamine product                            once biopsies return and/or steroid use.                           - If this is IBD, then once we get inflammation                            under control will need to consider repeat                            Colonoscopy to ensure mucosal/endoscopic healing.                           - Still need to get Stool studies that we ordered  previously and also add-on a Fecal Calprotectin.                           - The findings and recommendations were discussed                            with the patient. Justice Britain, MD 09/03/2019 2:47:02 PM

## 2019-09-07 ENCOUNTER — Other Ambulatory Visit: Payer: Self-pay

## 2019-09-07 ENCOUNTER — Telehealth: Payer: Self-pay

## 2019-09-07 DIAGNOSIS — R197 Diarrhea, unspecified: Secondary | ICD-10-CM

## 2019-09-07 NOTE — Telephone Encounter (Signed)
  Follow up Call-  Call back number 09/03/2019  Post procedure Call Back phone  # [phone]  Permission to leave phone message Yes  Some recent data might be hidden     Patient questions:  Do you have a fever, pain , or abdominal swelling? No. Pain Score  0 *  Have you tolerated food without any problems? Yes.    Have you been able to return to your normal activities? Yes.    Do you have any questions about your discharge instructions: Diet   No. Medications  No. Follow up visit  No.  Do you have questions or concerns about your Care? No.  Actions: * If pain score is 4 or above: No action needed, pain <4.  Pt states he is having increased gas with using fiber as recommended.  Advised to increase fiber intake slowly to decrease bloating and gas.  He agreed.  1. Have you developed a fever since your procedure? no  2.   Have you had an respiratory symptoms (SOB or cough) since your procedure? no  3.   Have you tested positive for COVID 19 since your procedure no  4.   Have you had any family members/close contacts diagnosed with the COVID 19 since your procedure?  no   If yes to any of these questions please route to Joylene John, RN and Alphonsa Gin, Therapist, sports.

## 2019-09-07 NOTE — Progress Notes (Signed)
fexcal calp

## 2019-09-10 ENCOUNTER — Encounter: Payer: Self-pay | Admitting: Gastroenterology

## 2019-09-10 ENCOUNTER — Other Ambulatory Visit: Payer: Self-pay

## 2019-09-10 DIAGNOSIS — R197 Diarrhea, unspecified: Secondary | ICD-10-CM

## 2019-09-13 ENCOUNTER — Other Ambulatory Visit: Payer: Medicare Other

## 2019-09-13 DIAGNOSIS — R197 Diarrhea, unspecified: Secondary | ICD-10-CM

## 2019-09-15 ENCOUNTER — Encounter: Payer: Self-pay | Admitting: Osteopathic Medicine

## 2019-09-15 ENCOUNTER — Ambulatory Visit (INDEPENDENT_AMBULATORY_CARE_PROVIDER_SITE_OTHER): Payer: Medicare Other | Admitting: Osteopathic Medicine

## 2019-09-15 ENCOUNTER — Ambulatory Visit: Payer: Medicare Other | Admitting: Physician Assistant

## 2019-09-15 VITALS — BP 126/83 | HR 75 | Temp 98.6°F | Ht 71.0 in | Wt 207.0 lb

## 2019-09-15 DIAGNOSIS — I10 Essential (primary) hypertension: Secondary | ICD-10-CM | POA: Diagnosis not present

## 2019-09-15 DIAGNOSIS — K5730 Diverticulosis of large intestine without perforation or abscess without bleeding: Secondary | ICD-10-CM | POA: Diagnosis not present

## 2019-09-15 DIAGNOSIS — Z7982 Long term (current) use of aspirin: Secondary | ICD-10-CM

## 2019-09-15 DIAGNOSIS — E785 Hyperlipidemia, unspecified: Secondary | ICD-10-CM

## 2019-09-15 DIAGNOSIS — Z79899 Other long term (current) drug therapy: Secondary | ICD-10-CM

## 2019-09-15 DIAGNOSIS — Z87438 Personal history of other diseases of male genital organs: Secondary | ICD-10-CM

## 2019-09-15 DIAGNOSIS — Z8679 Personal history of other diseases of the circulatory system: Secondary | ICD-10-CM

## 2019-09-15 DIAGNOSIS — R351 Nocturia: Secondary | ICD-10-CM

## 2019-09-15 DIAGNOSIS — Z8546 Personal history of malignant neoplasm of prostate: Secondary | ICD-10-CM

## 2019-09-15 DIAGNOSIS — Z87448 Personal history of other diseases of urinary system: Secondary | ICD-10-CM

## 2019-09-15 MED ORDER — SILDENAFIL CITRATE 100 MG PO TABS: 100.0000 mg | ORAL_TABLET | Freq: Every day | ORAL | 5 refills | Status: DC | PRN

## 2019-09-15 MED ORDER — DILTIAZEM HCL ER 240 MG PO CP24: 240.0000 mg | ORAL_CAPSULE | Freq: Every day | ORAL | 3 refills | Status: DC

## 2019-09-15 MED ORDER — TAMSULOSIN HCL 0.4 MG PO CAPS: 0.4000 mg | ORAL_CAPSULE | Freq: Every day | ORAL | 3 refills | Status: DC
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
## 2019-09-17 ENCOUNTER — Other Ambulatory Visit: Payer: Self-pay | Admitting: Gastroenterology

## 2019-09-17 ENCOUNTER — Other Ambulatory Visit: Payer: Self-pay

## 2019-09-17 ENCOUNTER — Telehealth: Payer: Self-pay | Admitting: Gastroenterology

## 2019-09-17 DIAGNOSIS — R197 Diarrhea, unspecified: Secondary | ICD-10-CM

## 2019-09-17 LAB — CALPROTECTIN, FECAL: Calprotectin, Fecal: 969 ug/g — ABNORMAL HIGH (ref 0–120)

## 2019-09-17 MED ORDER — MESALAMINE 1.2 G PO TBEC: DELAYED_RELEASE_TABLET | ORAL | 3 refills | Status: DC

## 2019-09-17 NOTE — Telephone Encounter (Signed)
The pt lab order has been added for 2 weeks and also prescription as ordered has been sent to the pharmacy.

## 2019-09-17 NOTE — Telephone Encounter (Signed)
The majority of the patient's stool studies have returned.  There is no evidence of an infectious etiology at this point in time.  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  The patient's fecal calprotectin was greater than 900.  These all go towards pointing the diagnosis of inflammatory bowel disease.  I would like to initiate the patient on oral topical medication to try and help get the symptoms under control.  We will plan on a mesalamine product hopefully Lialda versus Apriso.  Patient understands the need for periodic monitoring of electrolytes especially a BMP because of the risk of nephrotoxicity that has been associated with this.  I will plan to get these in approximately 2 weeks after the completion of the patient's initiation of medicine.  He has an early January visit with [place] and we can see him in follow-up and hopefully see how he does from there.  Patty, Please move forward with sending in a prescription for Lialda 4.8 g daily.  For the first 2 weeks I want the patient to take no more than 2.4 g so he can get used to it. Patient should return in 2 weeks after initiation of medication to come in for a CBC/BMP/Mag/Phos Patient will then return for follow-up thereafter that is a rescheduled for early January  Patient appreciative for the call back and looks forward to hopefully feeling better with his medication.  Justice Britain, MD [facility] Office # [phone]

## 2019-09-30 LAB — OVA AND PARASITE EXAMINATION
CONCENTRATE RESULT:: NONE SEEN
MICRO NUMBER:: 1194554
SPECIMEN QUALITY:: ADEQUATE
TRICHROME RESULT:: NONE SEEN

## 2019-09-30 LAB — CLOSTRIDIUM DIFFICILE TOXIN B, QUALITATIVE, REAL-TIME PCR: Toxigenic C. Difficile by PCR: NOT DETECTED

## 2019-09-30 LAB — STOOL CULTURE
MICRO NUMBER:: 1194552
MICRO NUMBER:: 1194553
MICRO NUMBER:: 1194555
SHIGA RESULT:: NOT DETECTED
SPECIMEN QUALITY:: ADEQUATE
SPECIMEN QUALITY:: ADEQUATE
SPECIMEN QUALITY:: ADEQUATE

## 2019-09-30 LAB — PANCREATIC ELASTASE, FECAL: Pancreatic Elastase-1, Stool: 491 mcg/g

## 2019-09-30 LAB — FECAL LACTOFERRIN, QUANT: LACTOFERRIN, QL, STOOL: POSITIVE — AB

## 2019-10-08 ENCOUNTER — Other Ambulatory Visit (INDEPENDENT_AMBULATORY_CARE_PROVIDER_SITE_OTHER): Payer: Medicare Other

## 2019-10-08 ENCOUNTER — Encounter: Payer: Self-pay | Admitting: Gastroenterology

## 2019-10-08 ENCOUNTER — Ambulatory Visit: Payer: Medicare Other | Admitting: Gastroenterology

## 2019-10-08 ENCOUNTER — Other Ambulatory Visit: Payer: Self-pay

## 2019-10-08 VITALS — BP 110/60 | HR 60 | Temp 97.8°F | Ht 71.0 in | Wt 211.0 lb

## 2019-10-08 DIAGNOSIS — K2210 Ulcer of esophagus without bleeding: Secondary | ICD-10-CM | POA: Diagnosis not present

## 2019-10-08 DIAGNOSIS — K209 Esophagitis, unspecified without bleeding: Secondary | ICD-10-CM | POA: Diagnosis not present

## 2019-10-08 DIAGNOSIS — K649 Unspecified hemorrhoids: Secondary | ICD-10-CM | POA: Diagnosis not present

## 2019-10-08 DIAGNOSIS — K5150 Left sided colitis without complications: Secondary | ICD-10-CM | POA: Diagnosis not present

## 2019-10-08 DIAGNOSIS — K529 Noninfective gastroenteritis and colitis, unspecified: Secondary | ICD-10-CM | POA: Insufficient documentation

## 2019-10-08 DIAGNOSIS — K51919 Ulcerative colitis, unspecified with unspecified complications: Secondary | ICD-10-CM | POA: Diagnosis not present

## 2019-10-08 DIAGNOSIS — K648 Other hemorrhoids: Secondary | ICD-10-CM | POA: Diagnosis not present

## 2019-10-08 LAB — CBC
HCT: 42.4 % (ref 39.0–52.0)
Hemoglobin: 14.4 g/dL (ref 13.0–17.0)
MCHC: 33.9 g/dL (ref 30.0–36.0)
MCV: 95.1 fl (ref 78.0–100.0)
Platelets: 204 10*3/uL (ref 150.0–400.0)
RBC: 4.45 Mil/uL (ref 4.22–5.81)
RDW: 13.7 % (ref 11.5–15.5)
WBC: 5.1 10*3/uL (ref 4.0–10.5)

## 2019-10-08 LAB — HIGH SENSITIVITY CRP: CRP, High Sensitivity: 2.17 mg/L (ref 0.000–5.000)

## 2019-10-08 LAB — COMPREHENSIVE METABOLIC PANEL
ALT: 15 U/L (ref 0–53)
AST: 17 U/L (ref 0–37)
Albumin: 4.1 g/dL (ref 3.5–5.2)
Alkaline Phosphatase: 80 U/L (ref 39–117)
BUN: 17 mg/dL (ref 6–23)
CO2: 28 mEq/L (ref 19–32)
Calcium: 9.1 mg/dL (ref 8.4–10.5)
Chloride: 106 mEq/L (ref 96–112)
Creatinine, Ser: 0.87 mg/dL (ref 0.40–1.50)
GFR: 85.21 mL/min (ref >60.00)
Glucose, Bld: 90 mg/dL (ref 70–99)
Potassium: 4.2 mEq/L (ref 3.5–5.1)
Sodium: 140 mEq/L (ref 135–145)
Total Bilirubin: 0.4 mg/dL (ref 0.2–1.2)
Total Protein: 6.7 g/dL (ref 6.0–8.3)

## 2019-10-08 LAB — VITAMIN D 25 HYDROXY (VIT D DEFICIENCY, FRACTURES): VITD: 38.48 ng/mL (ref 30.00–100.00)

## 2019-10-08 LAB — SEDIMENTATION RATE: Sed Rate: 13 mm/hr (ref 0–20)

## 2019-10-08 MED ORDER — OMEPRAZOLE 40 MG PO CPDR: 40.0000 mg | DELAYED_RELEASE_CAPSULE | Freq: Every day | ORAL | 3 refills | Status: DC

## 2019-10-08 MED ORDER — MESALAMINE 1.2 G PO TBEC: 4.8000 g | DELAYED_RELEASE_TABLET | Freq: Every day | ORAL | 4 refills | Status: DC
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
## 2019-10-08 NOTE — Patient Instructions (Signed)
Your provider has requested that you go to the basement level for lab work before leaving today. Press "B" on the elevator. The lab is located at the first door on the left as you exit the elevator.  We have sent the following medications to your pharmacy for you to pick up at your convenience: Lialda   Continue taking Omeprazole as directed.   If you are age 77 or older, your body mass index should be between 23-30. Your Body mass index is 29.43 kg/m. If this is out of the aforementioned range listed, please consider follow up with your Primary Care Provider.  It has been recommended to you by your physician that you have a(n) Endo completed.  We did not schedule the procedure(s) today as you do not need until March 2021. Please contact our office at [phone] in Feb 2021 to schedule a pre-visit and procedure at that time.  Thank you for choosing me and [facility].  Dr. Rush Landmark

## 2019-10-16 LAB — THIOPURINE METHYLTRANSFERASE (TPMT), RBC: Thiopurine Methyltransferase, RBC: 14 nmol/hr/mL RBC

## 2019-10-16 LAB — QUANTIFERON-TB GOLD PLUS
Mitogen-NIL: >10 IU/mL
NIL: 0.03 IU/mL
QuantiFERON-TB Gold Plus: NEGATIVE
TB1-NIL: 0 IU/mL
TB2-NIL: 0.01 IU/mL

## 2019-10-16 LAB — HEPATITIS B SURFACE ANTIBODY,QUALITATIVE: Hep B S Ab: NONREACTIVE

## 2019-10-16 LAB — HEPATITIS C ANTIBODY
Hepatitis C Ab: NONREACTIVE
SIGNAL TO CUT-OFF: 0.01 (ref <1.00)

## 2019-10-16 LAB — HEPATITIS B CORE ANTIBODY, TOTAL: Hep B Core Total Ab: NONREACTIVE

## 2019-10-16 LAB — HEPATITIS A ANTIBODY, TOTAL: Hepatitis A AB,Total: NONREACTIVE

## 2019-11-12 ENCOUNTER — Other Ambulatory Visit: Payer: Self-pay

## 2019-11-12 DIAGNOSIS — K2210 Ulcer of esophagus without bleeding: Secondary | ICD-10-CM

## 2019-11-12 DIAGNOSIS — K2970 Gastritis, unspecified, without bleeding: Secondary | ICD-10-CM

## 2019-11-12 DIAGNOSIS — K529 Noninfective gastroenteritis and colitis, unspecified: Secondary | ICD-10-CM

## 2019-11-12 DIAGNOSIS — K5150 Left sided colitis without complications: Secondary | ICD-10-CM

## 2019-11-12 DIAGNOSIS — R197 Diarrhea, unspecified: Secondary | ICD-10-CM

## 2019-11-12 NOTE — Progress Notes (Signed)
r 

## 2019-11-17 ENCOUNTER — Other Ambulatory Visit: Payer: Medicare Other

## 2019-11-17 DIAGNOSIS — K2210 Ulcer of esophagus without bleeding: Secondary | ICD-10-CM

## 2019-11-17 DIAGNOSIS — K5150 Left sided colitis without complications: Secondary | ICD-10-CM

## 2019-11-17 DIAGNOSIS — K648 Other hemorrhoids: Secondary | ICD-10-CM

## 2019-11-27 LAB — CALPROTECTIN, FECAL: Calprotectin, Fecal: 66 ug/g (ref 0–120)

## 2019-11-29 ENCOUNTER — Other Ambulatory Visit: Payer: Self-pay

## 2019-11-29 DIAGNOSIS — K5150 Left sided colitis without complications: Secondary | ICD-10-CM

## 2019-11-29 DIAGNOSIS — K529 Noninfective gastroenteritis and colitis, unspecified: Secondary | ICD-10-CM

## 2019-11-29 DIAGNOSIS — K2210 Ulcer of esophagus without bleeding: Secondary | ICD-10-CM

## 2020-01-14 ENCOUNTER — Other Ambulatory Visit: Payer: Self-pay | Admitting: Family Medicine

## 2020-01-14 ENCOUNTER — Encounter: Payer: Self-pay | Admitting: Family Medicine

## 2020-01-14 ENCOUNTER — Ambulatory Visit (INDEPENDENT_AMBULATORY_CARE_PROVIDER_SITE_OTHER): Payer: Medicare Other | Admitting: Family Medicine

## 2020-01-14 ENCOUNTER — Ambulatory Visit (INDEPENDENT_AMBULATORY_CARE_PROVIDER_SITE_OTHER): Payer: Medicare Other

## 2020-01-14 ENCOUNTER — Other Ambulatory Visit: Payer: Self-pay

## 2020-01-14 VITALS — BP 132/75 | HR 69 | Temp 97.8°F | Ht 71.0 in | Wt 207.0 lb

## 2020-01-14 DIAGNOSIS — M79645 Pain in left finger(s): Secondary | ICD-10-CM | POA: Diagnosis not present

## 2020-01-14 DIAGNOSIS — S62619A Displaced fracture of proximal phalanx of unspecified finger, initial encounter for closed fracture: Secondary | ICD-10-CM

## 2020-01-14 NOTE — Assessment & Plan Note (Addendum)
Xray ordered.  Fitted in malleable splint, secured with coban.  Will call with results.  Continue icing, tylenol for pain control.   Addendum:  Discussed with Dr. Darene Lamer.  He will see patient first thing Monday am and try to reduce fracture and splint.  If unable to to reduce he will refer to hand surgeon.

## 2020-01-14 NOTE — Patient Instructions (Signed)
Have xray completed, we'll be in touch with results.  Use splint for comfort
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
## 2020-01-17 ENCOUNTER — Ambulatory Visit (INDEPENDENT_AMBULATORY_CARE_PROVIDER_SITE_OTHER): Payer: Medicare Other | Admitting: Sports Medicine

## 2020-01-17 ENCOUNTER — Other Ambulatory Visit: Payer: Self-pay | Admitting: Sports Medicine

## 2020-01-17 ENCOUNTER — Other Ambulatory Visit: Payer: Self-pay

## 2020-01-17 ENCOUNTER — Ambulatory Visit (INDEPENDENT_AMBULATORY_CARE_PROVIDER_SITE_OTHER): Payer: Medicare Other

## 2020-01-17 DIAGNOSIS — S62641A Nondisplaced fracture of proximal phalanx of left index finger, initial encounter for closed fracture: Secondary | ICD-10-CM

## 2020-01-17 DIAGNOSIS — S62611A Displaced fracture of proximal phalanx of left index finger, initial encounter for closed fracture: Secondary | ICD-10-CM | POA: Diagnosis not present

## 2020-01-17 MED ORDER — HYDROCODONE-ACETAMINOPHEN 10-325 MG PO TABS: 1.0000 | ORAL_TABLET | Freq: Three times a day (TID) | ORAL | 0 refills | Status: DC | PRN

## 2020-01-17 NOTE — Assessment & Plan Note (Signed)
Davyon has a minimally apex volar angulated fracture of his left second proximal phalanx, today we performed a closed reduction with radial gutter splint placement, return to see me in a week to reevaluate and repeat x-rays. Hydrocodone for post reduction pain.

## 2020-01-17 NOTE — Progress Notes (Signed)
    Procedures performed today:    Procedure:  Fracture Reduction   Risks, benefits, and alternatives explained and consent obtained. Time out conducted. Surface prepped with alcohol. A total of 5 cc lidocaine used to perform a digital block on the left second finger Adequate anesthesia ensured. Fracture reduction: Accentuated the fracture angle, then applied axial traction and reduce the fracture. A radial gutter splint was applied. Post reduction films obtained showed anatomic/near-anatomic alignment. Pt stable, aftercare and follow-up advised.   Independent interpretation of notes and tests performed by another provider:   None.  Brief History, Exam, Impression, and Recommendations:    Closed nondisplaced fracture of proximal phalanx of left index finger Nicholas Garza has a minimally apex volar angulated fracture of his left second proximal phalanx, today we performed a closed reduction with radial gutter splint placement, return to see me in a week to reevaluate and repeat x-rays. Hydrocodone for post reduction pain.     ___________________________________________ Gwen Her. Dianah Field, M.D., ABFM., CAQSM. Primary Care and [place] Instructor of [place] [facility] of Medicine

## 2020-01-24 ENCOUNTER — Other Ambulatory Visit: Payer: Self-pay

## 2020-01-24 ENCOUNTER — Encounter: Payer: Self-pay | Admitting: Sports Medicine

## 2020-01-24 ENCOUNTER — Ambulatory Visit (INDEPENDENT_AMBULATORY_CARE_PROVIDER_SITE_OTHER): Payer: Medicare Other | Admitting: Sports Medicine

## 2020-01-24 ENCOUNTER — Ambulatory Visit (INDEPENDENT_AMBULATORY_CARE_PROVIDER_SITE_OTHER): Payer: Medicare Other

## 2020-01-24 DIAGNOSIS — S62641D Nondisplaced fracture of proximal phalanx of left index finger, subsequent encounter for fracture with routine healing: Secondary | ICD-10-CM

## 2020-01-24 DIAGNOSIS — S62641A Nondisplaced fracture of proximal phalanx of left index finger, initial encounter for closed fracture: Secondary | ICD-10-CM | POA: Diagnosis not present

## 2020-01-24 NOTE — Assessment & Plan Note (Signed)
Tanis returns, I reduced a left second proximal phalangeal fracture, it still has some angulation on x-ray today but acceptable. He will continue his radial gutter splint which fits well. I did have to trim off some of the proximal portion which was cutting into his skin. Return to see me in 4 weeks, x-ray before visit.

## 2020-01-24 NOTE — Progress Notes (Signed)
    Procedures performed today:    None.  Independent interpretation of notes and tests performed by another provider:   X-rays personally reviewed and still have a bit of angulation but acceptable.  Brief History, Exam, Impression, and Recommendations:    Closed nondisplaced fracture of proximal phalanx of left index finger Nicholas Garza returns, I reduced a left second proximal phalangeal fracture, it still has some angulation on x-ray today but acceptable. He will continue his radial gutter splint which fits well. I did have to trim off some of the proximal portion which was cutting into his skin. Return to see me in 4 weeks, x-ray before visit.    ___________________________________________ Gwen Her. Dianah Field, M.D., ABFM., CAQSM. Primary Care and [place] Instructor of [place] [employer] [facility] Medicine

## 2020-01-25 ENCOUNTER — Ambulatory Visit: Payer: Medicare Other | Admitting: Sports Medicine

## 2020-02-03 ENCOUNTER — Other Ambulatory Visit: Payer: Medicare Other

## 2020-02-21 ENCOUNTER — Ambulatory Visit (INDEPENDENT_AMBULATORY_CARE_PROVIDER_SITE_OTHER): Payer: Medicare Other | Admitting: Sports Medicine

## 2020-02-21 ENCOUNTER — Ambulatory Visit (INDEPENDENT_AMBULATORY_CARE_PROVIDER_SITE_OTHER): Payer: Medicare Other

## 2020-02-21 ENCOUNTER — Other Ambulatory Visit: Payer: Self-pay

## 2020-02-21 DIAGNOSIS — S62641D Nondisplaced fracture of proximal phalanx of left index finger, subsequent encounter for fracture with routine healing: Secondary | ICD-10-CM | POA: Diagnosis not present

## 2020-02-21 DIAGNOSIS — H9190 Unspecified hearing loss, unspecified ear: Secondary | ICD-10-CM

## 2020-02-21 NOTE — Assessment & Plan Note (Signed)
This is a very pleasant 79 year old male, I reduced a left second proximal phalangeal fracture approximately 5-1/2 weeks ago. He returns today doing well, x-rays were personally reviewed, they do show good bony callus at the site of the fracture. On exam he has no tenderness over the fracture, he has expected loss of range of motion at the MCP, we are going to start some physical therapy to get his strength, range of motion, and dexterity back. Discontinue splint, return in a month to reevaluate motion and function.

## 2020-02-21 NOTE — Progress Notes (Signed)
    Procedures performed today:    X-rays were personally reviewed, they do show good bony callus at the site of the fracture.  Independent interpretation of notes and tests performed by another provider:   None.  Brief History, Exam, Impression, and Recommendations:    Closed nondisplaced fracture of proximal phalanx of left index finger This is a very pleasant 79 year old male, I reduced a left second proximal phalangeal fracture approximately 5-1/2 weeks ago. He returns today doing well, x-rays were personally reviewed, they do show good bony callus at the site of the fracture. On exam he has no tenderness over the fracture, he has expected loss of range of motion at the MCP, we are going to start some physical therapy to get his strength, range of motion, and dexterity back. Discontinue splint, return in a month to reevaluate motion and function.    ___________________________________________ Gwen Her. Dianah Field, M.D., ABFM., CAQSM. Primary Care and [place] Instructor of [place] [facility]

## 2020-02-25 ENCOUNTER — Ambulatory Visit (INDEPENDENT_AMBULATORY_CARE_PROVIDER_SITE_OTHER): Payer: Medicare Other | Admitting: Rehabilitative and Restorative Service Providers"

## 2020-02-25 ENCOUNTER — Other Ambulatory Visit: Payer: Self-pay

## 2020-02-25 DIAGNOSIS — M25642 Stiffness of left hand, not elsewhere classified: Secondary | ICD-10-CM | POA: Diagnosis not present

## 2020-02-25 DIAGNOSIS — M79645 Pain in left finger(s): Secondary | ICD-10-CM

## 2020-02-25 DIAGNOSIS — M6281 Muscle weakness (generalized): Secondary | ICD-10-CM | POA: Diagnosis not present

## 2020-02-25 NOTE — Patient Instructions (Signed)
Access Code: [email or web address] URL: [email or web address] Date: 02/25/2020 Prepared by: Rudell Cobb  Exercises Finger MP Flexion AROM - 2 x daily - 7 x weekly - 1 sets - 15 reps Seated Finger DIP Flexion AROM with Blocking - 2 x daily - 7 x weekly - 10 reps - 1 sets Finger PIP Flexion Extension with Blocking - 2 x daily - 7 x weekly - 1 sets - 15 reps - 5 seconds hold Seated Finger DIP PROM - 2 x daily - 7 x weekly - 1 sets - 6 reps - 10 seconds hold Seated Claw Fist with Putty - 2 x daily - 7 x weekly - 10 reps - 1 sets Seated Claw Fist with Putty - 2 x daily - 7 x weekly - 1 sets - 10 reps  Patient Education Ice Massage
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
## 2020-03-01 ENCOUNTER — Other Ambulatory Visit: Payer: Medicare Other

## 2020-03-01 DIAGNOSIS — K5150 Left sided colitis without complications: Secondary | ICD-10-CM

## 2020-03-01 DIAGNOSIS — K2210 Ulcer of esophagus without bleeding: Secondary | ICD-10-CM

## 2020-03-01 DIAGNOSIS — K529 Noninfective gastroenteritis and colitis, unspecified: Secondary | ICD-10-CM

## 2020-03-03 LAB — CALPROTECTIN, FECAL: Calprotectin, Fecal: 366 ug/g — ABNORMAL HIGH (ref 0–120)

## 2020-03-06 ENCOUNTER — Ambulatory Visit (INDEPENDENT_AMBULATORY_CARE_PROVIDER_SITE_OTHER): Payer: Medicare Other | Admitting: Physical Therapy

## 2020-03-06 ENCOUNTER — Other Ambulatory Visit: Payer: Self-pay

## 2020-03-06 DIAGNOSIS — M6281 Muscle weakness (generalized): Secondary | ICD-10-CM | POA: Diagnosis not present

## 2020-03-06 DIAGNOSIS — M25642 Stiffness of left hand, not elsewhere classified: Secondary | ICD-10-CM

## 2020-03-06 DIAGNOSIS — K529 Noninfective gastroenteritis and colitis, unspecified: Secondary | ICD-10-CM

## 2020-03-06 DIAGNOSIS — M79645 Pain in left finger(s): Secondary | ICD-10-CM | POA: Diagnosis not present

## 2020-03-06 DIAGNOSIS — K5150 Left sided colitis without complications: Secondary | ICD-10-CM
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
## 2020-03-06 NOTE — Progress Notes (Signed)
Pt states that he is doing pretty good, pt states that he has some good days and bad days.Pt states that once in a while he will have a little extra gas. Pt states that he had a bad virus a couple of days before he left the stool sample for testing, pt states that he had some "bug" and had chills and just stayed in the bed, pt states that he thinks he should have waited a few days before leaving sample. Pt denies any n/v, or constipation. Pt states that some days he does go to the bathroom about 4-5 times. Pt is interested in leaving another sample to verify results if you agree.Please advise, thank you.

## 2020-03-06 NOTE — Patient Instructions (Signed)

## 2020-03-17 ENCOUNTER — Other Ambulatory Visit: Payer: Self-pay

## 2020-03-17 ENCOUNTER — Ambulatory Visit: Payer: Medicare Other | Admitting: Physical Therapy

## 2020-03-17 DIAGNOSIS — M25642 Stiffness of left hand, not elsewhere classified: Secondary | ICD-10-CM | POA: Diagnosis not present

## 2020-03-17 DIAGNOSIS — M6281 Muscle weakness (generalized): Secondary | ICD-10-CM

## 2020-03-17 DIAGNOSIS — M79645 Pain in left finger(s): Secondary | ICD-10-CM | POA: Diagnosis not present
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
## 2020-03-23 ENCOUNTER — Other Ambulatory Visit: Payer: Self-pay

## 2020-03-23 ENCOUNTER — Encounter: Payer: Self-pay | Admitting: Osteopathic Medicine

## 2020-03-23 ENCOUNTER — Ambulatory Visit (INDEPENDENT_AMBULATORY_CARE_PROVIDER_SITE_OTHER): Payer: Medicare Other | Admitting: Osteopathic Medicine

## 2020-03-23 ENCOUNTER — Ambulatory Visit (INDEPENDENT_AMBULATORY_CARE_PROVIDER_SITE_OTHER): Payer: Medicare Other | Admitting: Sports Medicine

## 2020-03-23 VITALS — BP 124/74 | HR 64 | Temp 97.8°F | Wt 205.7 lb

## 2020-03-23 DIAGNOSIS — I10 Essential (primary) hypertension: Secondary | ICD-10-CM | POA: Diagnosis not present

## 2020-03-23 DIAGNOSIS — Z0000 Encounter for general adult medical examination without abnormal findings: Secondary | ICD-10-CM

## 2020-03-23 DIAGNOSIS — E785 Hyperlipidemia, unspecified: Secondary | ICD-10-CM

## 2020-03-23 DIAGNOSIS — S62641D Nondisplaced fracture of proximal phalanx of left index finger, subsequent encounter for fracture with routine healing: Secondary | ICD-10-CM

## 2020-03-23 DIAGNOSIS — Z8679 Personal history of other diseases of the circulatory system: Secondary | ICD-10-CM

## 2020-03-23 LAB — COMPLETE METABOLIC PANEL WITH GFR
AG Ratio: 1.8 (calc) (ref 1.0–2.5)
ALT: 13 U/L (ref 9–46)
AST: 14 U/L (ref 10–35)
Albumin: 4.1 g/dL (ref 3.6–5.1)
Alkaline phosphatase (APISO): 89 U/L (ref 35–144)
BUN: 18 mg/dL (ref 7–25)
CO2: 26 mmol/L (ref 20–32)
Calcium: 9 mg/dL (ref 8.6–10.3)
Chloride: 107 mmol/L (ref 98–110)
Creat: 0.7 mg/dL (ref 0.70–1.18)
GFR, Est African American: 106 mL/min/{1.73_m2} (ref >=60)
GFR, Est Non African American: 92 mL/min/{1.73_m2} (ref >=60)
Globulin: 2.3 g/dL (calc) (ref 1.9–3.7)
Glucose, Bld: 95 mg/dL (ref 65–99)
Potassium: 4.1 mmol/L (ref 3.5–5.3)
Sodium: 139 mmol/L (ref 135–146)
Total Bilirubin: 0.5 mg/dL (ref 0.2–1.2)
Total Protein: 6.4 g/dL (ref 6.1–8.1)

## 2020-03-23 LAB — CBC
HCT: 40.6 % (ref 38.5–50.0)
Hemoglobin: 13.8 g/dL (ref 13.2–17.1)
MCH: 31.3 pg (ref 27.0–33.0)
MCHC: 34 g/dL (ref 32.0–36.0)
MCV: 92.1 fL (ref 80.0–100.0)
MPV: 9.5 fL (ref 7.5–12.5)
Platelets: 243 10*3/uL (ref 140–400)
RBC: 4.41 10*6/uL (ref 4.20–5.80)
RDW: 12 % (ref 11.0–15.0)
WBC: 5.6 10*3/uL (ref 3.8–10.8)

## 2020-03-23 LAB — LIPID PANEL
Cholesterol: 158 mg/dL (ref <200)
HDL: 58 mg/dL (ref >=40)
LDL Cholesterol (Calc): 86 mg/dL (calc)
Non-HDL Cholesterol (Calc): 100 mg/dL (calc) (ref <130)
Total CHOL/HDL Ratio: 2.7 (calc) (ref <5.0)
Triglycerides: 47 mg/dL (ref <150)

## 2020-03-23 NOTE — Progress Notes (Signed)
    Procedures performed today:    None.  Independent interpretation of notes and tests performed by another provider:   None.  Brief History, Exam, Impression, and Recommendations:    Closed nondisplaced fracture of proximal phalanx of left index finger This is a very pleasant 79 year old male, he had a left second proximal phalangeal fracture approximately 9 months ago. He had a good amount of loss of range of motion at the MCP, fracture was extra-articular. He has been doing physical therapy, he has gained good motion, he still has approximately 10 to 15 degrees of flexion lag at the metacarpophalangeal joint. I think he needs to do a little more physical therapy for now. Return to see me in a month, if insufficient improvement in motion I will inject his MCP.    ___________________________________________ Gwen Her. Dianah Field, M.D., ABFM., CAQSM. Primary Care and [place] Instructor of [place] of [facility] of Medicine

## 2020-03-23 NOTE — Assessment & Plan Note (Signed)
This is a very pleasant 79 year old male, he had a left second proximal phalangeal fracture approximately 9 months ago. He had a good amount of loss of range of motion at the MCP, fracture was extra-articular. He has been doing physical therapy, he has gained good motion, he still has approximately 10 to 15 degrees of flexion lag at the metacarpophalangeal joint. I think he needs to do a little more physical therapy for now. Return to see me in a month, if insufficient improvement in motion I will inject his MCP.

## 2020-03-23 NOTE — Progress Notes (Signed)
Nicholas Garza is a 79 y.o. male who presents to  [facility]  today, 03/23/20, seeking care for the following:  . Annual      ASSESSMENT & PLAN with other pertinent findings:  There were no encounter diagnoses.   No results found for this or any previous visit (from the past 24 hour(s)).   BP Readings from Last 3 Encounters:  03/23/20 124/74  01/14/20 132/75  10/08/19 110/60    General Preventive Care  Most recent routine screening labs: ordered.   Blood pressure goal 130/80 or less.   Tobacco: don't!   Alcohol: responsible moderation is ok for most adults - if you have concerns about your alcohol intake, please talk to me!   Exercise: as tolerated to reduce risk of cardiovascular disease and diabetes. Strength training will also prevent osteoporosis.   Mental health: if need for mental health care (medicines, counseling, other), or concerns about moods, please let me know!   Sexual / Reproductive health: if need for STD testing, or if concerns with libido/pain problems, please let me know! If you need to discuss family planning, please let me know!   Advanced Directive: Living Will and/or Healthcare Power of Attorney recommended for all adults, regardless of age or health.  Vaccines  Flu vaccine: for almost everyone, every fall.   Shingles vaccine: done  Pneumonia vaccines: done  Tetanus booster: every 10 years   COVID vaccine: THANKS for getting your vaccine! :)  Cancer screenings   Colon cancer screening: for everyone age 67-75.   Prostate cancer screening: PSA blood test age 52-71  Lung cancer screening: CT chest every year for those aged 13 to 47 years who have a 20 pack-year smoking history and currently smoke or have quit within the past 15 years  Infection screenings  . HIV: recommended screening at least once age 34-65, more often as needed. . Gonorrhea/Chlamydia: screening as  needed . Hepatitis C: recommended once for everyone age 34-75 . TB: certain at-risk populations, or depending on work requirements and/or travel history Other . Bone Density Test: recommended for men at age 42, sooner depending on risk factors . Abdominal Aortic Aneurysm: screening with ultrasound recommended once for men age 52-75 who have ever smoked       Constitutional:  . VSS, see nurse notes . General Appearance: alert, well-developed, well-nourished, NAD Eyes: [name] Normal lids and conjunctive, non-icteric sclera . PERRLA Ears . Normal appearance . Normal external auditory canal and TM bilaterall Neck: . No masses, trachea midline . No thyroid enlargement/tenderness/mass appreciated Respiratory: . Normal respiratory effort . Breath sounds normal, no wheeze/rhonchi/rales Cardiovascular: . S1/S2 normal, no murmur/rub/gallop auscultated . No lower extremity edema Gastrointestinal: . Nontender, no masses . No hepatomegaly, no splenomegaly . No hernia appreciated Musculoskeletal:  . Gait normal . No clubbing/cyanosis of digits Neurological: . No cranial nerve deficit on limited exam . Motor and sensation intact and symmetric Psychiatric: . Normal judgment/insight . Normal mood and affect     There are no Patient Instructions on file for this visit.  Orders Placed This Encounter  Procedures  . CBC  . COMPLETE METABOLIC PANEL WITH GFR  . Lipid panel    No orders of the defined types were placed in this encounter.      Follow-up instructions: Return in about 1 year (around 03/23/2021) for [place] (call week prior to visit for lab orders).  BP 124/74 (BP Location: Left Arm, Patient Position: Sitting, Cuff Size: Normal)   Pulse 64   Temp 97.8 F (36.6 C) (Oral)   Wt 205 lb 11.2 oz (93.3 kg)   BMI 28.69 kg/m   Current Meds  Medication Sig  . aspirin 81 MG tablet Take 81 mg by  mouth daily.  [name] diltiazem (DILACOR XR) 240 MG 24 hr capsule Take 1 capsule (240 mg total) by mouth daily.  [name] HYDROcodone-acetaminophen (NORCO) 10-325 MG tablet Take 1 tablet by mouth every 8 (eight) hours as needed.  . mesalamine (LIALDA) 1.2 g EC tablet Take 4 tablets (4.8 g total) by mouth daily with breakfast.  . omeprazole (PRILOSEC) 40 MG capsule Take 1 capsule (40 mg total) by mouth daily.  . sildenafil (VIAGRA) 100 MG tablet Take 1 tablet (100 mg total) by mouth daily as needed for erectile dysfunction.  . tamsulosin (FLOMAX) 0.4 MG CAPS capsule Take 1 capsule (0.4 mg total) by mouth daily after supper.    No results found for this or any previous visit (from the past 72 hour(s)).  No results found.     All questions at time of visit were answered - patient instructed to contact office with any additional concerns or updates.  ER/RTC precautions were reviewed with the patient as applicable.   Please note: voice recognition software was used to produce this document, and typos may escape review. Please contact Dr. Sheppard Coil for any needed clarifications.

## 2020-03-24 ENCOUNTER — Encounter: Payer: Self-pay | Admitting: Physical Therapy

## 2020-03-24 ENCOUNTER — Ambulatory Visit: Payer: Medicare Other | Admitting: Physical Therapy

## 2020-03-24 DIAGNOSIS — M6281 Muscle weakness (generalized): Secondary | ICD-10-CM

## 2020-03-24 DIAGNOSIS — M79645 Pain in left finger(s): Secondary | ICD-10-CM | POA: Diagnosis not present

## 2020-03-24 DIAGNOSIS — M25642 Stiffness of left hand, not elsewhere classified: Secondary | ICD-10-CM
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
## 2020-03-29 ENCOUNTER — Other Ambulatory Visit: Payer: Medicare Other

## 2020-03-29 DIAGNOSIS — K5150 Left sided colitis without complications: Secondary | ICD-10-CM

## 2020-03-29 DIAGNOSIS — K529 Noninfective gastroenteritis and colitis, unspecified: Secondary | ICD-10-CM

## 2020-03-31 ENCOUNTER — Other Ambulatory Visit: Payer: Self-pay

## 2020-03-31 ENCOUNTER — Ambulatory Visit: Payer: Medicare Other | Admitting: Physical Therapy

## 2020-03-31 DIAGNOSIS — M6281 Muscle weakness (generalized): Secondary | ICD-10-CM | POA: Diagnosis not present

## 2020-03-31 DIAGNOSIS — M25642 Stiffness of left hand, not elsewhere classified: Secondary | ICD-10-CM | POA: Diagnosis not present

## 2020-03-31 DIAGNOSIS — M79645 Pain in left finger(s): Secondary | ICD-10-CM
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
## 2020-04-01 LAB — CALPROTECTIN, FECAL: Calprotectin, Fecal: 73 ug/g (ref 0–120)

## 2020-04-11 ENCOUNTER — Other Ambulatory Visit: Payer: Self-pay

## 2020-04-11 DIAGNOSIS — K529 Noninfective gastroenteritis and colitis, unspecified: Secondary | ICD-10-CM

## 2020-04-14 ENCOUNTER — Other Ambulatory Visit: Payer: Self-pay

## 2020-04-14 ENCOUNTER — Encounter: Payer: Self-pay | Admitting: Rehabilitative and Restorative Service Providers"

## 2020-04-14 ENCOUNTER — Ambulatory Visit: Payer: Medicare Other | Admitting: Rehabilitative and Restorative Service Providers"

## 2020-04-14 DIAGNOSIS — M6281 Muscle weakness (generalized): Secondary | ICD-10-CM

## 2020-04-14 DIAGNOSIS — M79645 Pain in left finger(s): Secondary | ICD-10-CM | POA: Diagnosis not present

## 2020-04-14 DIAGNOSIS — M25642 Stiffness of left hand, not elsewhere classified: Secondary | ICD-10-CM | POA: Diagnosis not present
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
## 2020-04-17 ENCOUNTER — Other Ambulatory Visit: Payer: Medicare Other

## 2020-04-21 ENCOUNTER — Other Ambulatory Visit: Payer: Self-pay | Admitting: Gastroenterology

## 2020-05-03 ENCOUNTER — Other Ambulatory Visit: Payer: Medicare Other

## 2020-05-03 DIAGNOSIS — K529 Noninfective gastroenteritis and colitis, unspecified: Secondary | ICD-10-CM

## 2020-05-05 ENCOUNTER — Ambulatory Visit: Payer: Medicare Other | Admitting: Gastroenterology

## 2020-05-05 ENCOUNTER — Encounter: Payer: Self-pay | Admitting: Gastroenterology

## 2020-05-05 VITALS — BP 134/72 | HR 72 | Ht 71.5 in | Wt 204.0 lb

## 2020-05-05 DIAGNOSIS — K529 Noninfective gastroenteritis and colitis, unspecified: Secondary | ICD-10-CM | POA: Diagnosis not present

## 2020-05-05 DIAGNOSIS — Z8719 Personal history of other diseases of the digestive system: Secondary | ICD-10-CM

## 2020-05-05 LAB — CALPROTECTIN, FECAL: Calprotectin, Fecal: 186 ug/g — ABNORMAL HIGH (ref 0–120)

## 2020-05-05 MED ORDER — SUPREP BOWEL PREP KIT 17.5-3.13-1.6 GM/177ML PO SOLN
1.0000 | ORAL | 0 refills | Status: DC
Start: 2020-05-05 — End: 2020-07-04

## 2020-05-05 MED ORDER — MESALAMINE 1.2 G PO TBEC: 4.8000 g | DELAYED_RELEASE_TABLET | Freq: Every day | ORAL | 2 refills | Status: DC

## 2020-05-05 MED ORDER — OMEPRAZOLE 20 MG PO CPDR
20.0000 mg | DELAYED_RELEASE_CAPSULE | Freq: Every day | ORAL | 3 refills | Status: DC
Start: 2020-05-05 — End: 2020-09-12

## 2020-05-05 NOTE — Patient Instructions (Signed)
We have sent the following medications to your pharmacy for you to pick up at your convenience: Omeprazole, Mesalamine, Suprep   Decrease Omeprazole to 29m- once daily. New Rx sent to pharmacy.   You have been scheduled for an endoscopy and colonoscopy. Please follow the written instructions given to you at your visit today. Please pick up your prep supplies at the pharmacy within the next 1-3 days. If you use inhalers (even only as needed), please bring them with you on the day of your procedure.   If you are age 2421or older, your body mass index should be between 23-30. Your Body mass index is 28.06 kg/m. If this is out of the aforementioned range listed, please consider follow up with your Primary Care Provider.  If you are age 2424or younger, your body mass index should be between 19-25. Your Body mass index is 28.06 kg/m. If this is out of the aformentioned range listed, please consider follow up with your Primary Care Provider.   You will need labs day of your endoscopies. Order has been entered in epic.   [facility] for [facility].  Dr. MRush Landmark
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
## 2020-05-08 DIAGNOSIS — Z8719 Personal history of other diseases of the digestive system: Secondary | ICD-10-CM | POA: Insufficient documentation

## 2020-06-09 ENCOUNTER — Telehealth: Payer: Self-pay | Admitting: Rehabilitative and Restorative Service Providers"

## 2020-06-09 NOTE — Telephone Encounter (Signed)
Chart opened in error   Nicholas Dresden P. Nicholas Garza PT, MPH 06/09/20 9:58 AM

## 2020-07-03 ENCOUNTER — Encounter (INDEPENDENT_AMBULATORY_CARE_PROVIDER_SITE_OTHER): Payer: Self-pay

## 2020-07-03 ENCOUNTER — Encounter: Payer: Self-pay | Admitting: Certified Registered Nurse Anesthetist

## 2020-07-04 ENCOUNTER — Ambulatory Visit (AMBULATORY_SURGERY_CENTER): Payer: Medicare Other | Admitting: Gastroenterology

## 2020-07-04 ENCOUNTER — Encounter: Payer: Self-pay | Admitting: Gastroenterology

## 2020-07-04 ENCOUNTER — Other Ambulatory Visit: Payer: Self-pay

## 2020-07-04 VITALS — BP 129/74 | HR 61 | Temp 97.8°F | Resp 11 | Ht 71.0 in | Wt 204.0 lb

## 2020-07-04 DIAGNOSIS — K5730 Diverticulosis of large intestine without perforation or abscess without bleeding: Secondary | ICD-10-CM | POA: Diagnosis not present

## 2020-07-04 DIAGNOSIS — K51919 Ulcerative colitis, unspecified with unspecified complications: Secondary | ICD-10-CM | POA: Diagnosis not present

## 2020-07-04 DIAGNOSIS — K529 Noninfective gastroenteritis and colitis, unspecified: Secondary | ICD-10-CM

## 2020-07-04 DIAGNOSIS — K219 Gastro-esophageal reflux disease without esophagitis: Secondary | ICD-10-CM

## 2020-07-04 DIAGNOSIS — K641 Second degree hemorrhoids: Secondary | ICD-10-CM

## 2020-07-04 DIAGNOSIS — K6389 Other specified diseases of intestine: Secondary | ICD-10-CM

## 2020-07-04 DIAGNOSIS — K2980 Duodenitis without bleeding: Secondary | ICD-10-CM

## 2020-07-04 DIAGNOSIS — Z8719 Personal history of other diseases of the digestive system: Secondary | ICD-10-CM

## 2020-07-04 MED ORDER — SODIUM CHLORIDE 0.9 % IV SOLN: 500.0000 mL | Freq: Once | INTRAVENOUS | Status: DC

## 2020-07-04 NOTE — Progress Notes (Signed)
Report given to PACU, vss 

## 2020-07-04 NOTE — Patient Instructions (Signed)
Please read handouts provided. Continue present medications. Await pathology results.     YOU HAD AN ENDOSCOPIC PROCEDURE TODAY AT [facility]:   Refer to the procedure report that was given to you for any specific questions about what was found during the examination.  If the procedure report does not answer your questions, please call your gastroenterologist to clarify.  If you requested that your care partner not be given the details of your procedure findings, then the procedure report has been included in a sealed envelope for you to review at your convenience later.  YOU SHOULD EXPECT: Some feelings of bloating in the abdomen. Passage of more gas than usual.  Walking can help get rid of the air that was put into your GI tract during the procedure and reduce the bloating. If you had a lower endoscopy (such as a colonoscopy or flexible sigmoidoscopy) you may notice spotting of blood in your stool or on the toilet paper. If you underwent a bowel prep for your procedure, you may not have a normal bowel movement for a few days.  Please Note:  You might notice some irritation and congestion in your nose or some drainage.  This is from the oxygen used during your procedure.  There is no need for concern and it should clear up in a day or so.  SYMPTOMS TO REPORT IMMEDIATELY:   Following lower endoscopy (colonoscopy or flexible sigmoidoscopy):  Excessive amounts of blood in the stool  Significant tenderness or worsening of abdominal pains  Swelling of the abdomen that is new, acute  Fever of 100F or higher   Following upper endoscopy (EGD)  Vomiting of blood or coffee ground material  New chest pain or pain under the shoulder blades  Painful or persistently difficult swallowing  New shortness of breath  Fever of 100F or higher  Black, tarry-looking stools  For urgent or emergent issues, a gastroenterologist can be reached at any hour by calling [phone]. Do not  use MyChart messaging for urgent concerns.    DIET:  We do recommend a small meal at first, but then you may proceed to your regular diet.  Drink plenty of fluids but you should avoid alcoholic beverages for 24 hours.  ACTIVITY:  You should plan to take it easy for the rest of today and you should NOT DRIVE or use heavy machinery until tomorrow (because of the sedation medicines used during the test).    FOLLOW UP: Our staff will call the number listed on your records 48-72 hours following your procedure to check on you and address any questions or concerns that you may have regarding the information given to you following your procedure. If we do not reach you, we will leave a message.  We will attempt to reach you two times.  During this call, we will ask if you have developed any symptoms of COVID 19. If you develop any symptoms (ie: fever, flu-like symptoms, shortness of breath, cough etc.) before then, please call [phone].  If you test positive for Covid 19 in the 2 weeks post procedure, please call and report this information to [place].    If any biopsies were taken you will be contacted by phone or by letter within the next 1-3 weeks.  Please call us at [phone] if you have not heard about the biopsies in 3 weeks.    SIGNATURES/CONFIDENTIALITY: You and/or your care partner have signed paperwork which will be entered into your electronic medical  record.  These signatures attest to the fact that that the information above on your After Visit Summary has been reviewed and is understood.  Full responsibility of the confidentiality of this discharge information lies with you and/or your care-partner.

## 2020-07-04 NOTE — Progress Notes (Signed)
Called to room to assist during endoscopic procedure.  Patient ID and intended procedure confirmed with present staff. Received instructions for my participation in the procedure from the performing physician.  

## 2020-07-04 NOTE — Op Note (Signed)
[place] Patient Name: Nicholas Garza Procedure Date: 07/04/2020 7:59 AM MRN: [id number] Endoscopist: Justice Britain , MD Age: 79 Referring MD:  Date of Birth: [birth year] Gender: Male Account #: [id number] Procedure:                Upper GI endoscopy Indications:              Heartburn, Esophagitis, Follow-up of esophagitis Medicines:                Monitored Anesthesia Care Procedure:                Pre-Anesthesia Assessment:                           - Prior to the procedure, a History and Physical                            was performed, and patient medications and                            allergies were reviewed. The patient's tolerance of                            previous anesthesia was also reviewed. The risks                            and benefits of the procedure and the sedation                            options and risks were discussed with the patient.                            All questions were answered, and informed consent                            was obtained. Prior Anticoagulants: The patient has                            taken no previous anticoagulant or antiplatelet                            agents. ASA Grade Assessment: II - A patient with                            mild systemic disease. After reviewing the risks                            and benefits, the patient was deemed in                            satisfactory condition to undergo the procedure.                           After obtaining informed consent, the endoscope was  passed under direct vision. Throughout the                            procedure, the patient's blood pressure, pulse, and                            oxygen saturations were monitored continuously. The                            Endoscope was introduced through the mouth, and                            advanced to the second part of duodenum. The upper                            GI  endoscopy was accomplished without difficulty.                            The patient tolerated the procedure. Scope In: Scope Out: Findings:                 No gross lesions were noted in the proximal                            esophagus and in the mid esophagus and the majority                            of the distal esophagus (prior esophagitis has                            healed).                           Two islands of salmon-colored mucosa were present                            at 39 cm. No other visible abnormalities were                            present. Biopsies were taken with a cold forceps                            for histology to rule out Barrett's.                           A 3 cm hiatal hernia was present.                           No gross mucosal lesions were noted in the entire                            examined stomach.                           Multiple erosions without bleeding were  found in                            the duodenal bulb. Biopsies were taken with a cold                            forceps for histology and Helicobacter pylori                            testing.                           No other gross lesions were noted in the second                            portion of the duodenum. Complications:            No immediate complications. Estimated Blood Loss:     Estimated blood loss was minimal. Impression:               - No gross lesions in esophagus proximally. Prior                            esophagitis has healed. Salmon-colored islands                            suggestive of Barrett's esophagus - biopsied.                           - 3 cm hiatal hernia.                           - No gross lesions in the stomach.                           - Duodenal bulb erosions without bleeding.                            Biopsied. No gross lesions in the second portion of                            the duodenum. Recommendation:           -  Proceed to scheduled colonoscopy.                           - Await pathology results.                           - Will consider increasing PPI back to 40 mg after                            follow up of the pathology.                           - Observe patient's clinical course.                           -  Surveillance dictated if evidence of Barrett's is                            found.                           - The findings and recommendations were discussed                            with the patient. Justice Britain, MD 07/04/2020 8:46:21 AM

## 2020-07-04 NOTE — Progress Notes (Signed)
0759 Robinul 0.1 mg IV given due large amount of secretions upon assessment.  MD made aware, vss

## 2020-07-04 NOTE — Progress Notes (Signed)
Pt's states no medical or surgical changes since previsit or office visit.  CW - vitals

## 2020-07-04 NOTE — Op Note (Signed)
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 The patient's tolerance of                            previous anesthesia was also reviewed. The risks                            and benefits of the procedure and the sedation                            options and risks were discussed with the patient.                            All questions were answered, and informed consent                            was obtained. Prior Anticoagulants: The patient has                            taken no previous anticoagulant or antiplatelet                            agents. ASA Grade Assessment: II - A patient with                            mild systemic disease. After reviewing the risks                            and benefits, the patient was deemed in                            satisfactory condition to undergo the procedure.  After obtaining informed consent, the colonoscope                            was passed under direct vision. Throughout the                            procedure, the patient's blood pressure, pulse, and                            oxygen saturations were monitored continuously. The                            Colonoscope was introduced through the anus and                            advanced  to the 5 cm into the ileum. The                            colonoscopy was performed without difficulty. The                            patient tolerated the procedure. The quality of the                            bowel preparation was adequate. The terminal ileum,                            ileocecal valve, appendiceal orifice, and rectum                            were photographed. Scope In: 8:18:20 AM Scope Out: 8:37:25 AM Scope Withdrawal Time: 0 hours 15 minutes 17 seconds  Total Procedure Duration: 0 hours 19 minutes 5 seconds  Findings:                 The digital rectal exam findings include                            hemorrhoids. Pertinent negatives include no                            palpable rectal lesions.                           The terminal ileum and ileocecal valve appeared                            normal. Biopsies were taken with a cold forceps for                            histology.                           Lipoma in Ascending colon.  Normal mucosa was found in the entire colon.                            Biopsies were taken with a cold forceps for                            histology from the right colon for surveillance.                            Biopsies were taken with a cold forceps for                            histology from the left colon for surveillance.                           Localized moderate inflammation characterized by                            erythema, friability and granularity was found in                            the rectum. Biopsies were taken with a cold forceps                            for histology to rule out active proctitis.                           Many small-mouthed diverticula were found in the                            recto-sigmoid colon, sigmoid colon and descending                            colon.                           Non-bleeding non-thrombosed external and internal                             hemorrhoids were found during retroflexion, during                            perianal exam and during digital exam. The                            hemorrhoids were Grade II (internal hemorrhoids                            that prolapse but reduce spontaneously). Complications:            No immediate complications. Estimated Blood Loss:     Estimated blood loss was minimal. Impression:               - Hemorrhoids found on digital rectal exam.                           -  The examined portion of the ileum was normal.                            Biopsied.                           - Lipoma in ascending colon.                           - Normal mucosa in the entire examined colon.                            Biopsied for surveillance.                           - Localized moderate inflammation was found in the                            rectum. Biopsied.                           - Diverticulosis in the recto-sigmoid colon, in the                            sigmoid colon and in the descending colon.                           - Non-bleeding non-thrombosed external and internal                            hemorrhoids. Recommendation:           - The patient will be observed post-procedure,                            until all discharge criteria are met.                           - Discharge patient to home.                           - Patient has a contact number available for                            emergencies. The signs and symptoms of potential                            delayed complications were discussed with the                            patient. Return to normal activities tomorrow.                            Written discharge instructions were provided to the                            patient.                           -  Resume previous diet.                           - Continue present medications.                           - Await pathology results.                            - Repeat colonoscopy for surveillance based on                            pathology results.                           - Will consider additional local therapies if the                            patient is found to have active proctitis.                           - The findings and recommendations were discussed                            with the patient. Justice Britain, MD 07/04/2020 8:56:59 AM

## 2020-07-06 ENCOUNTER — Telehealth: Payer: Self-pay

## 2020-07-06 NOTE — Telephone Encounter (Signed)
Covid-19 screening questions   Do you now or have you had a fever in the last 14 days? No.  Do you have any respiratory symptoms of shortness of breath or cough now or in the last 14 days? No.  Do you have any family members or close contacts with diagnosed or suspected Covid-19 in the past 14 days? No.  Have you been tested for Covid-19 and found to be positive? No.       Follow up Call-  Call back number 07/04/2020 09/03/2019  Post procedure Call Back phone  # [phone]  Permission to leave phone message Yes Yes  Some recent data might be hidden     Patient questions:  Do you have a fever, pain , or abdominal swelling? No. Pain Score  0 *  Have you tolerated food without any problems? Yes.    Have you been able to return to your normal activities? Yes.    Do you have any questions about your discharge instructions: Diet   No. Medications  No. Follow up visit  No.  Do you have questions or concerns about your Care? No.  Actions: * If pain score is 4 or above: No action needed, pain <4.

## 2020-07-13 ENCOUNTER — Other Ambulatory Visit: Payer: Self-pay

## 2020-07-13 ENCOUNTER — Encounter: Payer: Self-pay | Admitting: Gastroenterology

## 2020-07-13 MED ORDER — OMEPRAZOLE 40 MG PO CPDR: 40.0000 mg | DELAYED_RELEASE_CAPSULE | Freq: Every day | ORAL | 3 refills | Status: DC

## 2020-09-12 ENCOUNTER — Ambulatory Visit (INDEPENDENT_AMBULATORY_CARE_PROVIDER_SITE_OTHER): Payer: Medicare Other | Admitting: Gastroenterology

## 2020-09-12 ENCOUNTER — Other Ambulatory Visit (INDEPENDENT_AMBULATORY_CARE_PROVIDER_SITE_OTHER): Payer: Medicare Other

## 2020-09-12 ENCOUNTER — Encounter: Payer: Self-pay | Admitting: Gastroenterology

## 2020-09-12 VITALS — BP 136/66 | HR 71 | Ht 72.0 in | Wt 209.0 lb

## 2020-09-12 DIAGNOSIS — K2950 Unspecified chronic gastritis without bleeding: Secondary | ICD-10-CM

## 2020-09-12 DIAGNOSIS — K5150 Left sided colitis without complications: Secondary | ICD-10-CM | POA: Diagnosis not present

## 2020-09-12 DIAGNOSIS — Z8719 Personal history of other diseases of the digestive system: Secondary | ICD-10-CM | POA: Diagnosis not present

## 2020-09-12 LAB — BASIC METABOLIC PANEL
BUN: 17 mg/dL (ref 6–23)
CO2: 25 mEq/L (ref 19–32)
Calcium: 8.7 mg/dL (ref 8.4–10.5)
Chloride: 104 mEq/L (ref 96–112)
Creatinine, Ser: 0.94 mg/dL (ref 0.40–1.50)
GFR: 78.25 mL/min (ref >60.00)
Glucose, Bld: 114 mg/dL — ABNORMAL HIGH (ref 70–99)
Potassium: 3.7 mEq/L (ref 3.5–5.1)
Sodium: 139 mEq/L (ref 135–145)

## 2020-09-12 LAB — CBC
HCT: 41.6 % (ref 39.0–52.0)
Hemoglobin: 14 g/dL (ref 13.0–17.0)
MCHC: 33.7 g/dL (ref 30.0–36.0)
MCV: 93.9 fl (ref 78.0–100.0)
Platelets: 211 10*3/uL (ref 150.0–400.0)
RBC: 4.43 Mil/uL (ref 4.22–5.81)
RDW: 13.1 % (ref 11.5–15.5)
WBC: 7.3 10*3/uL (ref 4.0–10.5)

## 2020-09-12 LAB — HIGH SENSITIVITY CRP: CRP, High Sensitivity: 2.16 mg/L (ref 0.000–5.000)

## 2020-09-12 LAB — SEDIMENTATION RATE: Sed Rate: 11 mm/hr (ref 0–20)
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
## 2020-09-12 NOTE — Patient Instructions (Signed)
Your provider has requested that you go to the basement level for lab work before leaving today. Press "B" on the elevator. The lab is located at the first door on the left as you exit the elevator.  If you are age 77 or older, your body mass index should be between 23-30. Your Body mass index is 28.35 kg/m. If this is out of the aforementioned range listed, please consider follow up with your Primary Care Provider.  If you are age 34 or younger, your body mass index should be between 19-25. Your Body mass index is 28.35 kg/m. If this is out of the aformentioned range listed, please consider follow up with your Primary Care Provider.    Thank you for choosing me and [facility].  Dr. Rush Landmark

## 2020-09-13 ENCOUNTER — Other Ambulatory Visit: Payer: Self-pay

## 2020-09-13 DIAGNOSIS — K5150 Left sided colitis without complications: Secondary | ICD-10-CM

## 2020-10-06 ENCOUNTER — Other Ambulatory Visit: Payer: Self-pay | Admitting: Sports Medicine

## 2020-10-06 ENCOUNTER — Other Ambulatory Visit: Payer: Self-pay | Admitting: Osteopathic Medicine

## 2020-10-06 DIAGNOSIS — Z8679 Personal history of other diseases of the circulatory system: Secondary | ICD-10-CM

## 2020-10-06 DIAGNOSIS — R351 Nocturia: Secondary | ICD-10-CM

## 2020-10-06 DIAGNOSIS — Z8546 Personal history of malignant neoplasm of prostate: Secondary | ICD-10-CM

## 2020-10-06 MED ORDER — DILTIAZEM HCL ER 240 MG PO CP24
240.0000 mg | ORAL_CAPSULE | Freq: Every day | ORAL | 0 refills | Status: DC
Start: 2020-10-06 — End: 2021-01-18

## 2020-11-04 ENCOUNTER — Other Ambulatory Visit: Payer: Self-pay | Admitting: Osteopathic Medicine

## 2020-11-07 ENCOUNTER — Other Ambulatory Visit: Payer: Self-pay | Admitting: Gastroenterology

## 2021-01-18 ENCOUNTER — Other Ambulatory Visit: Payer: Self-pay

## 2021-01-18 ENCOUNTER — Telehealth: Payer: Self-pay | Admitting: Osteopathic Medicine

## 2021-01-18 DIAGNOSIS — Z8679 Personal history of other diseases of the circulatory system: Secondary | ICD-10-CM

## 2021-01-18 MED ORDER — DILTIAZEM HCL ER 240 MG PO CP24: 240.0000 mg | ORAL_CAPSULE | Freq: Every day | ORAL | 1 refills | Status: DC

## 2021-01-18 NOTE — Telephone Encounter (Signed)
Task  completed. Rx sent to the patient's pharmacy.

## 2021-01-18 NOTE — Telephone Encounter (Signed)
LVM letting patient know information below. AM

## 2021-01-18 NOTE — Telephone Encounter (Signed)
Patient came in stating that he needed a refill of medication listed below. Routing to the covering provider. AM  diltiazem (DILACOR XR) 240 MG 24 hr capsule

## 2021-04-30 ENCOUNTER — Other Ambulatory Visit: Payer: Self-pay | Admitting: Osteopathic Medicine

## 2021-04-30 DIAGNOSIS — R351 Nocturia: Secondary | ICD-10-CM

## 2021-04-30 DIAGNOSIS — Z8546 Personal history of malignant neoplasm of prostate: Secondary | ICD-10-CM

## 2021-05-24 ENCOUNTER — Other Ambulatory Visit: Payer: Self-pay | Admitting: Gastroenterology

## 2021-06-06 ENCOUNTER — Other Ambulatory Visit: Payer: Self-pay | Admitting: Gastroenterology

## 2021-07-09 ENCOUNTER — Ambulatory Visit (INDEPENDENT_AMBULATORY_CARE_PROVIDER_SITE_OTHER): Payer: Medicare Other | Admitting: Family Medicine

## 2021-07-09 ENCOUNTER — Encounter: Payer: Self-pay | Admitting: Family Medicine

## 2021-07-09 VITALS — BP 121/58 | HR 69 | Temp 97.7°F | Ht 72.0 in | Wt 191.1 lb

## 2021-07-09 DIAGNOSIS — Z8546 Personal history of malignant neoplasm of prostate: Secondary | ICD-10-CM

## 2021-07-09 DIAGNOSIS — I10 Essential (primary) hypertension: Secondary | ICD-10-CM | POA: Diagnosis not present

## 2021-07-09 DIAGNOSIS — R351 Nocturia: Secondary | ICD-10-CM

## 2021-07-09 DIAGNOSIS — Z8679 Personal history of other diseases of the circulatory system: Secondary | ICD-10-CM | POA: Diagnosis not present

## 2021-07-09 DIAGNOSIS — K529 Noninfective gastroenteritis and colitis, unspecified: Secondary | ICD-10-CM

## 2021-07-09 DIAGNOSIS — E785 Hyperlipidemia, unspecified: Secondary | ICD-10-CM

## 2021-07-09 DIAGNOSIS — I471 Supraventricular tachycardia: Secondary | ICD-10-CM

## 2021-07-09 MED ORDER — DILTIAZEM HCL ER 240 MG PO CP24: 240.0000 mg | ORAL_CAPSULE | Freq: Every day | ORAL | 3 refills | Status: DC

## 2021-07-09 MED ORDER — SILDENAFIL CITRATE 100 MG PO TABS: ORAL_TABLET | ORAL | 5 refills | Status: DC

## 2021-07-09 MED ORDER — TAMSULOSIN HCL 0.4 MG PO CAPS: ORAL_CAPSULE | ORAL | 3 refills | Status: DC

## 2021-07-09 NOTE — Assessment & Plan Note (Signed)
He denies any increased symptoms at this time.

## 2021-07-09 NOTE — Assessment & Plan Note (Signed)
Blood pressure is well controlled at this time.  He will continue diltiazem at current strength.

## 2021-07-09 NOTE — Assessment & Plan Note (Signed)
Stable symptoms for patient.  Continue current management per gastroenterology.

## 2021-07-09 NOTE — Assessment & Plan Note (Signed)
Status post radioactive seed implant.  He continues to see urology.  Tamsulosin renewed for lower urinary tract symptoms.

## 2021-07-09 NOTE — Patient Instructions (Signed)
Great to see you today! Continue current medications.  We'll be in touch to follow up on results.
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
## 2021-07-11 LAB — CBC WITH DIFFERENTIAL/PLATELET
Absolute Monocytes: 501 cells/uL (ref 200–950)
Basophils Absolute: 33 cells/uL (ref 0–200)
Basophils Relative: 0.5 %
Eosinophils Absolute: 163 cells/uL (ref 15–500)
Eosinophils Relative: 2.5 %
HCT: 43.3 % (ref 38.5–50.0)
Hemoglobin: 14.5 g/dL (ref 13.2–17.1)
Lymphs Abs: 2054 cells/uL (ref 850–3900)
MCH: 31.2 pg (ref 27.0–33.0)
MCHC: 33.5 g/dL (ref 32.0–36.0)
MCV: 93.1 fL (ref 80.0–100.0)
MPV: 9.5 fL (ref 7.5–12.5)
Monocytes Relative: 7.7 %
Neutro Abs: 3751 cells/uL (ref 1500–7800)
Neutrophils Relative %: 57.7 %
Platelets: 276 10*3/uL (ref 140–400)
RBC: 4.65 10*6/uL (ref 4.20–5.80)
RDW: 12.2 % (ref 11.0–15.0)
Total Lymphocyte: 31.6 %
WBC: 6.5 10*3/uL (ref 3.8–10.8)

## 2021-07-11 LAB — COMPLETE METABOLIC PANEL WITH GFR
AG Ratio: 1.8 (calc) (ref 1.0–2.5)
ALT: 22 U/L (ref 9–46)
AST: 22 U/L (ref 10–35)
Albumin: 3.8 g/dL (ref 3.6–5.1)
Alkaline phosphatase (APISO): 89 U/L (ref 35–144)
BUN: 16 mg/dL (ref 7–25)
CO2: 29 mmol/L (ref 20–32)
Calcium: 8.8 mg/dL (ref 8.6–10.3)
Chloride: 104 mmol/L (ref 98–110)
Creat: 0.8 mg/dL (ref 0.70–1.28)
Globulin: 2.1 g/dL (calc) (ref 1.9–3.7)
Glucose, Bld: 85 mg/dL (ref 65–99)
Potassium: 4.7 mmol/L (ref 3.5–5.3)
Sodium: 140 mmol/L (ref 135–146)
Total Bilirubin: 0.5 mg/dL (ref 0.2–1.2)
Total Protein: 5.9 g/dL — ABNORMAL LOW (ref 6.1–8.1)
eGFR: 91 mL/min/{1.73_m2} (ref >=60)

## 2021-07-11 LAB — LIPID PANEL W/REFLEX DIRECT LDL
Cholesterol: 157 mg/dL (ref <200)
HDL: 49 mg/dL (ref >=40)
LDL Cholesterol (Calc): 94 mg/dL (calc)
Non-HDL Cholesterol (Calc): 108 mg/dL (calc) (ref <130)
Total CHOL/HDL Ratio: 3.2 (calc) (ref <5.0)
Triglycerides: 58 mg/dL (ref <150)

## 2021-07-11 LAB — TSH: TSH: 1.31 mIU/L (ref 0.40–4.50)

## 2021-08-11 ENCOUNTER — Other Ambulatory Visit: Payer: Self-pay | Admitting: Gastroenterology

## 2021-10-24 ENCOUNTER — Other Ambulatory Visit: Payer: Self-pay | Admitting: Gastroenterology

## 2021-10-26 ENCOUNTER — Ambulatory Visit (INDEPENDENT_AMBULATORY_CARE_PROVIDER_SITE_OTHER): Payer: Medicare Other | Admitting: Family Medicine

## 2021-10-26 DIAGNOSIS — Z0000 Encounter for general adult medical examination without abnormal findings: Secondary | ICD-10-CM | POA: Diagnosis not present
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
## 2021-10-26 NOTE — Patient Instructions (Addendum)
[place] Maintenance Summary and Written Plan of Care  [name] ,  Thank you for allowing me to perform your Medicare Annual Wellness Visit and for your ongoing commitment to your health.   Health Maintenance & Immunization History Health Maintenance  Topic Date Due   COVID-19 Vaccine (4 - Booster for Pfizer series) 11/11/2021 (Originally 09/07/2020)   Zoster Vaccines- Shingrix (2 of 2) 01/24/2022 (Originally 08/10/2021)   COLONOSCOPY (Pts 45-22yr Insurance coverage will need to be confirmed)  07/05/2023   TETANUS/TDAP  11/06/2024   Pneumonia Vaccine 81 Years old  Completed   INFLUENZA VACCINE  Completed   Hepatitis C Screening  Completed   HPV VACCINES  Aged Out   Immunization History  Administered Date(s) Administered   Influenza Split 07/01/2011, 07/01/2015, 06/30/2017   Influenza Whole 07/13/2010   Influenza, High Dose Seasonal PF 06/30/2016, 07/14/2018, 05/24/2019, 06/15/2021   Influenza-Unspecified 06/13/2021   PFIZER(Purple Top)SARS-COV-2 Vaccination 10/14/2019, 11/04/2019, 07/13/2020   Pneumococcal Conjugate-13 07/01/2014   Pneumococcal Polysaccharide-23 11/06/2009   Td 11/06/2009   Tdap 11/06/2014   Zoster Recombinat (Shingrix) 06/15/2021   Zoster, Live 03/01/2015    These are the patient goals that we discussed:  Goals Addressed              This Visit's Progress     Patient Stated (pt-stated)        10/26/2021 AWV Goal: Improved Nutrition/Diet  Patient will verbalize understanding that diet plays an important role in overall health and that a poor diet is a risk factor for many chronic medical conditions.  Over the next year, patient will improve self management of their diet by incorporating more water. Patient will utilize available community resources to help with food acquisition if needed (ex: food pantries, [address], etc) Patient will work with nutrition specialist if a referral was made          This is a list of Health Maintenance Items that are overdue or due now: 2nd dose of Shingrix vaccine  Orders/Referrals Placed Today: No orders of the defined types were placed in this encounter.  (Contact our referral department at [phone] you have not spoken with someone about your referral appointment within the next 5 days)    Follow-up Plan Follow-up with Nicholas Nutting DO as planned Please call your pharmacy and schedule your second dose of the shingles vaccine. Medicare wellness visit in one year. Patient will access AVS on my chart.      Health Maintenance, Male Adopting a healthy lifestyle and getting preventive care are important in promoting health and wellness. Ask your health care provider about: The right schedule for you to have regular tests and exams. Things you can do on your own to prevent diseases and keep yourself healthy. What should I know about diet, weight, and exercise? Eat a healthy diet  Eat a diet that includes plenty of vegetables, fruits, low-fat dairy products, and lean protein. Do not eat a lot of foods that are high in solid fats, added sugars, or sodium. Maintain a healthy weight Body mass index (BMI) is a measurement that can be used to identify possible weight problems. It estimates body fat based on height and weight. Your health care provider can help determine your BMI and help you achieve or maintain a healthy weight. Get regular exercise Get regular exercise. This is one of the most important things you can do for your health. Most adults should: Exercise for at least 150 minutes each week.  The exercise should increase your heart rate and make you sweat (moderate-intensity exercise). Do strengthening exercises at least twice a week. This is in addition to the moderate-intensity exercise. Spend less time sitting. Even light physical activity can be beneficial. Watch cholesterol and blood lipids Have your blood tested for lipids  and cholesterol at 80 years of age, then have this test every 5 years. You may need to have your cholesterol levels checked more often if: Your lipid or cholesterol levels are high. You are older than 79 years of age. You are at high risk for heart disease. What should I know about cancer screening? Many types of cancers can be detected early and may often be prevented. Depending on your health history and family history, you may need to have cancer screening at various ages. This may include screening for: Colorectal cancer. Prostate cancer. Skin cancer. Lung cancer. What should I know about heart disease, diabetes, and high blood pressure? Blood pressure and heart disease High blood pressure causes heart disease and increases the risk of stroke. This is more likely to develop in people who have high blood pressure readings or are overweight. Talk with your health care provider about your target blood pressure readings. Have your blood pressure checked: Every 3-5 years if you are 66-52 years of age. Every year if you are 30 years old or older. If you are between the ages of 44 and 29 and are a current or former smoker, ask your health care provider if you should have a one-time screening for abdominal aortic aneurysm (AAA). Diabetes Have regular diabetes screenings. This checks your fasting blood sugar level. Have the screening done: Once every three years after age 2 if you are at a normal weight and have a low risk for diabetes. More often and at a younger age if you are overweight or have a high risk for diabetes. What should I know about preventing infection? Hepatitis B If you have a higher risk for hepatitis B, you should be screened for this virus. Talk with your health care provider to find out if you are at risk for hepatitis B infection. Hepatitis C Blood testing is recommended for: Everyone born from [birth year] through 1963. Anyone with known risk factors for hepatitis  C. Sexually transmitted infections (STIs) You should be screened each year for STIs, including gonorrhea and chlamydia, if: You are sexually active and are younger than 79 years of age. You are older than 79 years of age and your health care provider tells you that you are at risk for this type of infection. Your sexual activity has changed since you were last screened, and you are at increased risk for chlamydia or gonorrhea. Ask your health care provider if you are at risk. Ask your health care provider about whether you are at high risk for HIV. Your health care provider may recommend a prescription medicine to help prevent HIV infection. If you choose to take medicine to prevent HIV, you should first get tested for HIV. You should then be tested every 3 months for as long as you are taking the medicine. Follow these instructions at home: Alcohol use Do not drink alcohol if your health care provider tells you not to drink. If you drink alcohol: Limit how much you have to 0-2 drinks a day. Know how much alcohol is in your drink. In the [place] one drink equals one 12 oz bottle of beer (355 mL), one 5 oz glass of wine (148 mL), or  one 1 oz glass of hard liquor (44 mL). Lifestyle Do not use any products that contain nicotine or tobacco. These products include cigarettes, chewing tobacco, and vaping devices, such as e-cigarettes. If you need help quitting, ask your health care provider. Do not use street drugs. Do not share needles. Ask your health care provider for help if you need support or information about quitting drugs. General instructions Schedule regular health, dental, and eye exams. Stay current with your vaccines. Tell your health care provider if: You often feel depressed. You have ever been abused or do not feel safe at home. Summary Adopting a healthy lifestyle and getting preventive care are important in promoting health and wellness. Follow your health care provider's  instructions about healthy diet, exercising, and getting tested or screened for diseases. Follow your health care provider's instructions on monitoring your cholesterol and blood pressure. This information is not intended to replace advice given to you by your health care provider. Make sure you discuss any questions you have with your health care provider. Document Revised: 02/05/2021 Document Reviewed: 02/05/2021 Elsevier Patient Education  [place].

## 2021-11-30 ENCOUNTER — Other Ambulatory Visit: Payer: Self-pay | Admitting: Gastroenterology

## 2022-01-10 ENCOUNTER — Other Ambulatory Visit: Payer: Self-pay | Admitting: Osteopathic Medicine

## 2022-01-14 NOTE — Telephone Encounter (Signed)
V.Mail left for patient to call office and set appt. ?

## 2022-01-14 NOTE — Telephone Encounter (Signed)
Pls contact the pt to schedule HTN follow-up from 2022. Thanks ?

## 2022-02-11 ENCOUNTER — Encounter: Payer: Self-pay | Admitting: Family Medicine

## 2022-02-11 ENCOUNTER — Ambulatory Visit (INDEPENDENT_AMBULATORY_CARE_PROVIDER_SITE_OTHER): Payer: Medicare Other | Admitting: Family Medicine

## 2022-02-11 DIAGNOSIS — I10 Essential (primary) hypertension: Secondary | ICD-10-CM | POA: Diagnosis not present
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
## 2022-02-11 NOTE — Assessment & Plan Note (Signed)
Blood pressure mains well controlled with diltiazem at current strength.  Recommend continuation at this time. ?

## 2022-03-09 ENCOUNTER — Other Ambulatory Visit: Payer: Self-pay | Admitting: Gastroenterology

## 2022-06-17 ENCOUNTER — Other Ambulatory Visit: Payer: Self-pay | Admitting: Gastroenterology

## 2022-06-24 ENCOUNTER — Other Ambulatory Visit: Payer: Self-pay | Admitting: Gastroenterology

## 2022-06-27 ENCOUNTER — Telehealth: Payer: Self-pay | Admitting: Gastroenterology

## 2022-06-27 MED ORDER — OMEPRAZOLE 20 MG PO CPDR: DELAYED_RELEASE_CAPSULE | ORAL | 1 refills | Status: DC

## 2022-06-27 MED ORDER — MESALAMINE 1.2 G PO TBEC: DELAYED_RELEASE_TABLET | ORAL | 1 refills | Status: DC

## 2022-06-27 NOTE — Telephone Encounter (Signed)
Inbound call from patient states he needs his rx omeprazole and mesalamine filled. He made an OV appt for next available 12/6. Please advise.

## 2022-06-27 NOTE — Telephone Encounter (Signed)
Rx sent to pharmacy as required.

## 2022-07-10 ENCOUNTER — Other Ambulatory Visit: Payer: Self-pay

## 2022-07-10 DIAGNOSIS — R351 Nocturia: Secondary | ICD-10-CM

## 2022-07-10 DIAGNOSIS — Z8546 Personal history of malignant neoplasm of prostate: Secondary | ICD-10-CM

## 2022-07-10 DIAGNOSIS — Z8679 Personal history of other diseases of the circulatory system: Secondary | ICD-10-CM

## 2022-07-10 MED ORDER — TAMSULOSIN HCL 0.4 MG PO CAPS: ORAL_CAPSULE | ORAL | 3 refills | Status: DC

## 2022-07-10 MED ORDER — DILTIAZEM HCL ER 240 MG PO CP24: 240.0000 mg | ORAL_CAPSULE | Freq: Every day | ORAL | 3 refills | Status: DC

## 2022-08-14 ENCOUNTER — Encounter: Payer: Self-pay | Admitting: Family Medicine

## 2022-08-14 ENCOUNTER — Ambulatory Visit (INDEPENDENT_AMBULATORY_CARE_PROVIDER_SITE_OTHER): Payer: Medicare Other | Admitting: Family Medicine

## 2022-08-14 VITALS — BP 128/71 | HR 72 | Ht 72.0 in | Wt 207.1 lb

## 2022-08-14 DIAGNOSIS — Z8546 Personal history of malignant neoplasm of prostate: Secondary | ICD-10-CM

## 2022-08-14 DIAGNOSIS — R972 Elevated prostate specific antigen [PSA]: Secondary | ICD-10-CM

## 2022-08-14 DIAGNOSIS — E785 Hyperlipidemia, unspecified: Secondary | ICD-10-CM | POA: Diagnosis not present

## 2022-08-14 DIAGNOSIS — I471 Supraventricular tachycardia, unspecified: Secondary | ICD-10-CM | POA: Diagnosis not present

## 2022-08-14 DIAGNOSIS — I10 Essential (primary) hypertension: Secondary | ICD-10-CM

## 2022-08-14 DIAGNOSIS — K529 Noninfective gastroenteritis and colitis, unspecified: Secondary | ICD-10-CM

## 2022-08-14 MED ORDER — SILDENAFIL CITRATE 100 MG PO TABS: ORAL_TABLET | ORAL | 5 refills | Status: AC
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
## 2022-08-17 LAB — CBC WITH DIFFERENTIAL/PLATELET
Absolute Monocytes: 463 cells/uL (ref 200–950)
Basophils Absolute: 31 cells/uL (ref 0–200)
Basophils Relative: 0.6 %
Eosinophils Absolute: 130 cells/uL (ref 15–500)
Eosinophils Relative: 2.5 %
HCT: 41.7 % (ref 38.5–50.0)
Hemoglobin: 14.5 g/dL (ref 13.2–17.1)
Lymphs Abs: 1466 cells/uL (ref 850–3900)
MCH: 32 pg (ref 27.0–33.0)
MCHC: 34.8 g/dL (ref 32.0–36.0)
MCV: 92.1 fL (ref 80.0–100.0)
MPV: 9.7 fL (ref 7.5–12.5)
Monocytes Relative: 8.9 %
Neutro Abs: 3110 cells/uL (ref 1500–7800)
Neutrophils Relative %: 59.8 %
Platelets: 179 10*3/uL (ref 140–400)
RBC: 4.53 10*6/uL (ref 4.20–5.80)
RDW: 12.2 % (ref 11.0–15.0)
Total Lymphocyte: 28.2 %
WBC: 5.2 10*3/uL (ref 3.8–10.8)

## 2022-08-17 LAB — COMPLETE METABOLIC PANEL WITH GFR
AG Ratio: 1.7 (calc) (ref 1.0–2.5)
ALT: 15 U/L (ref 9–46)
AST: 16 U/L (ref 10–35)
Albumin: 4 g/dL (ref 3.6–5.1)
Alkaline phosphatase (APISO): 80 U/L (ref 35–144)
BUN: 18 mg/dL (ref 7–25)
CO2: 28 mmol/L (ref 20–32)
Calcium: 9 mg/dL (ref 8.6–10.3)
Chloride: 104 mmol/L (ref 98–110)
Creat: 0.77 mg/dL (ref 0.70–1.28)
Globulin: 2.4 g/dL (calc) (ref 1.9–3.7)
Glucose, Bld: 91 mg/dL (ref 65–99)
Potassium: 4.4 mmol/L (ref 3.5–5.3)
Sodium: 138 mmol/L (ref 135–146)
Total Bilirubin: 0.5 mg/dL (ref 0.2–1.2)
Total Protein: 6.4 g/dL (ref 6.1–8.1)
eGFR: 91 mL/min/{1.73_m2} (ref >=60)

## 2022-08-17 LAB — LIPID PANEL W/REFLEX DIRECT LDL
Cholesterol: 173 mg/dL (ref <200)
HDL: 59 mg/dL (ref >=40)
LDL Cholesterol (Calc): 101 mg/dL (calc) — ABNORMAL HIGH
Non-HDL Cholesterol (Calc): 114 mg/dL (calc) (ref <130)
Total CHOL/HDL Ratio: 2.9 (calc) (ref <5.0)
Triglycerides: 44 mg/dL (ref <150)

## 2022-08-17 LAB — TSH: TSH: 2.13 mIU/L (ref 0.40–4.50)

## 2022-08-18 NOTE — Assessment & Plan Note (Signed)
Blood pressure remains stable.  No symptoms or new arrhythmia.  He will continue on diltiazem at current strength for now.

## 2022-08-18 NOTE — Assessment & Plan Note (Signed)
Followed by GI.  UC remains well controlled with Lialda.

## 2022-08-18 NOTE — Assessment & Plan Note (Addendum)
History of prostate cancer.  Followed by urology.  Status post radioactive seed implants.  Urinary symptoms stable with Flomax.

## 2022-09-04 ENCOUNTER — Encounter: Payer: Self-pay | Admitting: Gastroenterology

## 2022-09-04 ENCOUNTER — Other Ambulatory Visit: Payer: Medicare Other

## 2022-09-04 ENCOUNTER — Ambulatory Visit: Payer: Medicare Other | Admitting: Gastroenterology

## 2022-09-04 VITALS — BP 138/72 | HR 66 | Ht 72.0 in | Wt 202.6 lb

## 2022-09-04 DIAGNOSIS — K529 Noninfective gastroenteritis and colitis, unspecified: Secondary | ICD-10-CM

## 2022-09-04 DIAGNOSIS — K5150 Left sided colitis without complications: Secondary | ICD-10-CM | POA: Diagnosis not present

## 2022-09-04 MED ORDER — MESALAMINE 1.2 G PO TBEC: DELAYED_RELEASE_TABLET | ORAL | 3 refills | Status: DC

## 2022-09-04 MED ORDER — OMEPRAZOLE 20 MG PO CPDR: DELAYED_RELEASE_CAPSULE | ORAL | 2 refills | Status: DC
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
## 2022-09-04 NOTE — Patient Instructions (Addendum)
We have sent the following medications to your pharmacy for you to pick up at your convenience: Lialda, Omeprazole    Your provider has requested that you go to the basement level for lab work before May 2024 Press "B" on the elevator. The lab is located at the first door on the left as you exit the elevator.  Due to recent changes in healthcare laws, you may see the results of your imaging and laboratory studies on MyChart before your provider has had a chance to review them.  We understand that in some cases there may be results that are confusing or concerning to you. Not all laboratory results come back in the same time frame and the provider may be waiting for multiple results in order to interpret others.  Please give [place] 48 hours in order for your provider to thoroughly review all the results before contacting the office for clarification of your results.   Thank you for choosing me and [facility].  Dr. Rush Landmark

## 2022-09-09 ENCOUNTER — Other Ambulatory Visit: Payer: Medicare Other

## 2022-09-09 DIAGNOSIS — K529 Noninfective gastroenteritis and colitis, unspecified: Secondary | ICD-10-CM

## 2022-09-13 LAB — CALPROTECTIN, FECAL: Calprotectin, Fecal: 171 ug/g — ABNORMAL HIGH (ref 0–120)

## 2022-11-01 ENCOUNTER — Ambulatory Visit (INDEPENDENT_AMBULATORY_CARE_PROVIDER_SITE_OTHER): Payer: Medicare Other | Admitting: Family Medicine

## 2022-11-01 VITALS — BP 118/67 | HR 71 | Ht 72.0 in | Wt 207.4 lb

## 2022-11-01 DIAGNOSIS — Z0000 Encounter for general adult medical examination without abnormal findings: Secondary | ICD-10-CM | POA: Diagnosis not present

## 2022-11-01 NOTE — Patient Instructions (Addendum)
[place] Maintenance Summary and Written Plan of Care  [name] ,  Thank you for allowing me to perform your Medicare Annual Wellness Visit and for your ongoing commitment to your health.   Health Maintenance & Immunization History Health Maintenance  Topic Date Due   COVID-19 Vaccine (4 - 2023-24 season) 11/17/2022 (Originally 05/31/2022)   COLONOSCOPY (Pts 45-28yr Insurance coverage will need to be confirmed)  07/05/2023   Medicare Annual Wellness (AWV)  11/02/2023   DTaP/Tdap/Td (3 - Td or Tdap) 11/06/2024   Pneumonia Vaccine 80 Years old  Completed   INFLUENZA VACCINE  Completed   Hepatitis C Screening  Completed   Zoster Vaccines- Shingrix  Completed   HPV VACCINES  Aged Out   Immunization History  Administered Date(s) Administered   Fluad Quad(high Dose 65+) 06/18/2022   Influenza Split 07/01/2011, 07/01/2015, 06/30/2017   Influenza Whole 07/13/2010   Influenza, High Dose Seasonal PF 06/30/2016, 07/14/2018, 05/24/2019, 06/15/2021   Influenza-Unspecified 06/13/2021, 07/13/2022   PFIZER(Purple Top)SARS-COV-2 Vaccination 10/14/2019, 11/04/2019, 07/13/2020   Pneumococcal Conjugate-13 07/01/2014   Pneumococcal Polysaccharide-23 11/06/2009   Td 11/06/2009   Tdap 11/06/2014   Zoster Recombinat (Shingrix) 06/15/2021, 11/20/2021   Zoster, Live 03/01/2015    These are the patient goals that we discussed:  Goals Addressed               This Visit's Progress     Patient Stated (pt-stated)        11/01/2022 AWV Goal: Improved Nutrition/Diet  Patient will verbalize understanding that diet plays an important role in overall health and that a poor diet is a risk factor for many chronic medical conditions.  Over the next year, patient will improve self management of their diet by incorporating more water. Patient will utilize available community resources to help with food acquisition if needed (ex: food pantries, [address], etc) Patient will  work with nutrition specialist if a referral was made          This is a list of Health Maintenance Items that are overdue or due now: There are no preventive care reminders to display for this patient. Colorectal cancer screening - due in October.   Orders/Referrals Placed Today: No orders of the defined types were placed in this encounter.  (Contact our referral department at [phone] you have not spoken with someone about your referral appointment within the next 5 days)    Follow-up Plan Follow-up with MLuetta Nutting DO as planned Medicare wellness visit in one year.  AVS printed and given to the patient.      Health Maintenance, Male Adopting a healthy lifestyle and getting preventive care are important in promoting health and wellness. Ask your health care provider about: The right schedule for you to have regular tests and exams. Things you can do on your own to prevent diseases and keep yourself healthy. What should I know about diet, weight, and exercise? Eat a healthy diet  Eat a diet that includes plenty of vegetables, fruits, low-fat dairy products, and lean protein. Do not eat a lot of foods that are high in solid fats, added sugars, or sodium. Maintain a healthy weight Body mass index (BMI) is a measurement that can be used to identify possible weight problems. It estimates body fat based on height and weight. Your health care provider can help determine your BMI and help you achieve or maintain a healthy weight. Get regular exercise Get regular exercise. This is one of the most  important things you can do for your health. Most adults should: Exercise for at least 150 minutes each week. The exercise should increase your heart rate and make you sweat (moderate-intensity exercise). Do strengthening exercises at least twice a week. This is in addition to the moderate-intensity exercise. Spend less time sitting. Even light physical activity can be  beneficial. Watch cholesterol and blood lipids Have your blood tested for lipids and cholesterol at 80 years of age, then have this test every 5 years. You may need to have your cholesterol levels checked more often if: Your lipid or cholesterol levels are high. You are older than 80 years of age. You are at high risk for heart disease. What should I know about cancer screening? Many types of cancers can be detected early and may often be prevented. Depending on your health history and family history, you may need to have cancer screening at various ages. This may include screening for: Colorectal cancer. Prostate cancer. Skin cancer. Lung cancer. What should I know about heart disease, diabetes, and high blood pressure? Blood pressure and heart disease High blood pressure causes heart disease and increases the risk of stroke. This is more likely to develop in people who have high blood pressure readings or are overweight. Talk with your health care provider about your target blood pressure readings. Have your blood pressure checked: Every 3-5 years if you are 6-23 years of age. Every year if you are 77 years old or older. If you are between the ages of 64 and 8 and are a current or former smoker, ask your health care provider if you should have a one-time screening for abdominal aortic aneurysm (AAA). Diabetes Have regular diabetes screenings. This checks your fasting blood sugar level. Have the screening done: Once every three years after age 65 if you are at a normal weight and have a low risk for diabetes. More often and at a younger age if you are overweight or have a high risk for diabetes. What should I know about preventing infection? Hepatitis B If you have a higher risk for hepatitis B, you should be screened for this virus. Talk with your health care provider to find out if you are at risk for hepatitis B infection. Hepatitis C Blood testing is recommended for: Everyone  born from [birth year] through 1963. Anyone with known risk factors for hepatitis C. Sexually transmitted infections (STIs) You should be screened each year for STIs, including gonorrhea and chlamydia, if: You are sexually active and are younger than 80 years of age. You are older than 80 years of age and your health care provider tells you that you are at risk for this type of infection. Your sexual activity has changed since you were last screened, and you are at increased risk for chlamydia or gonorrhea. Ask your health care provider if you are at risk. Ask your health care provider about whether you are at high risk for HIV. Your health care provider may recommend a prescription medicine to help prevent HIV infection. If you choose to take medicine to prevent HIV, you should first get tested for HIV. You should then be tested every 3 months for as long as you are taking the medicine. Follow these instructions at home: Alcohol use Do not drink alcohol if your health care provider tells you not to drink. If you drink alcohol: Limit how much you have to 0-2 drinks a day. Know how much alcohol is in your drink. In the [place] one  drink equals one 12 oz bottle of beer (355 mL), one 5 oz glass of wine (148 mL), or one 1 oz glass of hard liquor (44 mL). Lifestyle Do not use any products that contain nicotine or tobacco. These products include cigarettes, chewing tobacco, and vaping devices, such as e-cigarettes. If you need help quitting, ask your health care provider. Do not use street drugs. Do not share needles. Ask your health care provider for help if you need support or information about quitting drugs. General instructions Schedule regular health, dental, and eye exams. Stay current with your vaccines. Tell your health care provider if: You often feel depressed. You have ever been abused or do not feel safe at home. Summary Adopting a healthy lifestyle and getting preventive care are important  in promoting health and wellness. Follow your health care provider's instructions about healthy diet, exercising, and getting tested or screened for diseases. Follow your health care provider's instructions on monitoring your cholesterol and blood pressure. This information is not intended to replace advice given to you by your health care provider. Make sure you discuss any questions you have with your health care provider. Document Revised: 02/05/2021 Document Reviewed: 02/05/2021 Elsevier Patient Education  [place].
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
## 2023-01-08 ENCOUNTER — Encounter: Payer: Self-pay | Admitting: Gastroenterology

## 2023-02-11 ENCOUNTER — Telehealth: Payer: Self-pay | Admitting: Gastroenterology

## 2023-02-11 NOTE — Telephone Encounter (Signed)
PT is 82 and has a recall for a colonoscopy. Should he be scheduled. Please advise

## 2023-02-11 NOTE — Telephone Encounter (Signed)
Dr Meridee Score this pt has an appt for colon. Is he ok to keep or does he need an office visit?

## 2023-02-12 ENCOUNTER — Ambulatory Visit (INDEPENDENT_AMBULATORY_CARE_PROVIDER_SITE_OTHER): Payer: Medicare Other | Admitting: Family Medicine

## 2023-02-12 ENCOUNTER — Encounter: Payer: Self-pay | Admitting: Family Medicine

## 2023-02-12 VITALS — BP 114/67 | HR 64 | Ht 72.0 in | Wt 207.0 lb

## 2023-02-12 DIAGNOSIS — I10 Essential (primary) hypertension: Secondary | ICD-10-CM | POA: Diagnosis not present

## 2023-02-12 DIAGNOSIS — Z8546 Personal history of malignant neoplasm of prostate: Secondary | ICD-10-CM | POA: Diagnosis not present

## 2023-02-12 DIAGNOSIS — K5150 Left sided colitis without complications: Secondary | ICD-10-CM

## 2023-02-12 NOTE — Assessment & Plan Note (Signed)
BP remains well controlled.  Continue diltiazem at current strength.
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
## 2023-02-12 NOTE — Assessment & Plan Note (Signed)
Seeing GI.  Has upcoming colonoscopy.  Remains on lialda.

## 2023-02-12 NOTE — Assessment & Plan Note (Signed)
Management per urology.  Stable at this time. Remains on flomax for LUTs.

## 2023-02-14 NOTE — Telephone Encounter (Signed)
Shon see the note from Dr Meridee Score pt is ok to keep appt as planned

## 2023-02-14 NOTE — Telephone Encounter (Signed)
Patty, This patient has recently seen within the last year, he can be a direct procedure and does not need to be seen in clinic unless he has concerns or wants to be. Thanks. GM

## 2023-02-18 ENCOUNTER — Other Ambulatory Visit (INDEPENDENT_AMBULATORY_CARE_PROVIDER_SITE_OTHER): Payer: Medicare Other

## 2023-02-18 ENCOUNTER — Ambulatory Visit (INDEPENDENT_AMBULATORY_CARE_PROVIDER_SITE_OTHER): Payer: Medicare Other | Admitting: Sports Medicine

## 2023-02-18 DIAGNOSIS — M170 Bilateral primary osteoarthritis of knee: Secondary | ICD-10-CM | POA: Diagnosis not present

## 2023-02-18 NOTE — Progress Notes (Signed)
    Procedures performed today:    Procedure: Real-time Ultrasound Guided injection of the left knee Device: Samsung HS60  Verbal informed consent obtained.  Time-out conducted.  Noted no overlying erythema, induration, or other signs of local infection.  Skin prepped in a sterile fashion.  Local anesthesia: Topical Ethyl chloride.  With sterile technique and under real time ultrasound guidance: Trace effusion noted, 1 cc Kenalog 40, 2 cc lidocaine, 2 cc bupivacaine injected easily Completed without difficulty  Advised to call if fevers/chills, erythema, induration, drainage, or persistent bleeding.  Images permanently stored and available for review in PACS.  Impression: Technically successful ultrasound guided injection.  Procedure: Real-time Ultrasound Guided injection of the right knee Device: Samsung HS60  Verbal informed consent obtained.  Time-out conducted.  Noted no overlying erythema, induration, or other signs of local infection.  Skin prepped in a sterile fashion.  Local anesthesia: Topical Ethyl chloride.  With sterile technique and under real time ultrasound guidance: Trace effusion noted, 1 cc Kenalog 40, 2 cc lidocaine, 2 cc bupivacaine injected easily Completed without difficulty  Advised to call if fevers/chills, erythema, induration, drainage, or persistent bleeding.  Images permanently stored and available for review in PACS.  Impression: Technically successful ultrasound guided injection.  Independent interpretation of notes and tests performed by another provider:   None.  Brief History, Exam, Impression, and Recommendations:    Primary osteoarthritis of both knees This is a very pleasant 82 year old male, we have been avoiding meloxicam and ibuprofen as he did have a mild GI bleed, he has been on Celebrex, unfortunately has not had significant improvement in knee pain over the past few years, today we did bilateral knee joint injections, return to see me in  6 weeks as needed.    ____________________________________________ Nicholas Garza. [name], M.D., ABFM., CAQSM., AME. Primary Care and Sports Medicine [facility]  Adjunct Professor of Family Medicine  [place] [employer]  Restaurant manager, fast food

## 2023-02-18 NOTE — Assessment & Plan Note (Signed)
This is a very pleasant 82 year old male, we have been avoiding meloxicam and ibuprofen as he did have a mild GI bleed, he has been on Celebrex, unfortunately has not had significant improvement in knee pain over the past few years, today we did bilateral knee joint injections, return to see me in 6 weeks as needed.

## 2023-03-31 ENCOUNTER — Ambulatory Visit (AMBULATORY_SURGERY_CENTER): Payer: Medicare Other

## 2023-03-31 ENCOUNTER — Encounter: Payer: Self-pay | Admitting: Gastroenterology

## 2023-03-31 VITALS — Ht 73.0 in | Wt 205.0 lb

## 2023-03-31 DIAGNOSIS — Z1211 Encounter for screening for malignant neoplasm of colon: Secondary | ICD-10-CM

## 2023-03-31 MED ORDER — NA SULFATE-K SULFATE-MG SULF 17.5-3.13-1.6 GM/177ML PO SOLN: 1.0000 | Freq: Once | ORAL | 0 refills | Status: AC

## 2023-03-31 NOTE — Progress Notes (Signed)
No egg or soy allergy known to patient  No issues known to pt with past sedation with any surgeries or procedures Patient denies ever being told they had issues or difficulty with intubation  No FH of Malignant Hyperthermia Pt is not on diet pills Pt is not on  home 02  Pt is not on blood thinners  Pt denies issues with constipation  No A fib  Takes diltiazem for A flutter Have any cardiac testing pending--no Pt instructed to use Singlecare.com or GoodRx for a price reduction on prep  Patient's chart reviewed by Cathlyn Parsons CNRA prior to previsit and patient appropriate for the [facility].  Previsit completed and red dot placed by patient's name on their procedure day (on provider's schedule).

## 2023-04-29 ENCOUNTER — Ambulatory Visit (AMBULATORY_SURGERY_CENTER): Payer: Medicare Other | Admitting: Gastroenterology

## 2023-04-29 ENCOUNTER — Encounter: Payer: Self-pay | Admitting: Gastroenterology

## 2023-04-29 VITALS — BP 129/75 | HR 65 | Temp 96.7°F | Resp 12 | Ht 72.0 in | Wt 205.0 lb

## 2023-04-29 DIAGNOSIS — K529 Noninfective gastroenteritis and colitis, unspecified: Secondary | ICD-10-CM

## 2023-04-29 DIAGNOSIS — K5190 Ulcerative colitis, unspecified, without complications: Secondary | ICD-10-CM | POA: Diagnosis not present

## 2023-04-29 DIAGNOSIS — Z09 Encounter for follow-up examination after completed treatment for conditions other than malignant neoplasm: Secondary | ICD-10-CM

## 2023-04-29 DIAGNOSIS — Z1211 Encounter for screening for malignant neoplasm of colon: Secondary | ICD-10-CM | POA: Diagnosis not present

## 2023-04-29 DIAGNOSIS — Z8719 Personal history of other diseases of the digestive system: Secondary | ICD-10-CM

## 2023-04-29 MED ORDER — SODIUM CHLORIDE 0.9 % IV SOLN: 500.0000 mL | INTRAVENOUS | Status: DC

## 2023-04-29 NOTE — Patient Instructions (Signed)
Resume previous diet and medications. Awaiting pathology results. Repeat Colonoscopy date to be determined based on pathology results. Handouts provided on Diverticulosis and Hemorrhoids.   YOU HAD AN ENDOSCOPIC PROCEDURE TODAY AT THE [facility]:   Refer to the procedure report that was given to you for any specific questions about what was found during the examination.  If the procedure report does not answer your questions, please call your gastroenterologist to clarify.  If you requested that your care partner not be given the details of your procedure findings, then the procedure report has been included in a sealed envelope for you to review at your convenience later.  YOU SHOULD EXPECT: Some feelings of bloating in the abdomen. Passage of more gas than usual.  Walking can help get rid of the air that was put into your GI tract during the procedure and reduce the bloating. If you had a lower endoscopy (such as a colonoscopy or flexible sigmoidoscopy) you may notice spotting of blood in your stool or on the toilet paper. If you underwent a bowel prep for your procedure, you may not have a normal bowel movement for a few days.  Please Note:  You might notice some irritation and congestion in your nose or some drainage.  This is from the oxygen used during your procedure.  There is no need for concern and it should clear up in a day or so.  SYMPTOMS TO REPORT IMMEDIATELY:  Following lower endoscopy (colonoscopy or flexible sigmoidoscopy):  Excessive amounts of blood in the stool  Significant tenderness or worsening of abdominal pains  Swelling of the abdomen that is new, acute  Fever of 100F or higher  For urgent or emergent issues, a gastroenterologist can be reached at any hour by calling [phone]. Do not use MyChart messaging for urgent concerns.    DIET:  We do recommend a small meal at first, but then you may proceed to your regular diet.  Drink plenty of fluids but  you should avoid alcoholic beverages for 24 hours.  ACTIVITY:  You should plan to take it easy for the rest of today and you should NOT DRIVE or use heavy machinery until tomorrow (because of the sedation medicines used during the test).    FOLLOW UP: Our staff will call the number listed on your records the next business day following your procedure.  We will call around 7:15- 8:00 am to check on you and address any questions or concerns that you may have regarding the information given to you following your procedure. If we do not reach you, we will leave a message.     If any biopsies were taken you will be contacted by phone or by letter within the next 1-3 weeks.  Please call us at [phone] if you have not heard about the biopsies in 3 weeks.    SIGNATURES/CONFIDENTIALITY: You and/or your care partner have signed paperwork which will be entered into your electronic medical record.  These signatures attest to the fact that that the information above on your After Visit Summary has been reviewed and is understood.  Full responsibility of the confidentiality of this discharge information lies with you and/or your care-partner.
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
## 2023-04-29 NOTE — Op Note (Signed)
[facility] Patient Name: Unknown Manganelli Procedure Date: 04/29/2023 9:01 AM MRN: [id number] Endoscopist: Corliss Parish , MD, [id number] Age: 82 Referring MD:  Date of Birth: 21-Jul-1941 Gender: Male Account #: [id number] Procedure:                Colonoscopy Indications:              High risk colon cancer surveillance: Ulcerative                            left sided colitis, evaluation medication therapy                            in setting of UC Medicines:                Monitored Anesthesia Care Procedure:                Pre-Anesthesia Assessment:                           - Prior to the procedure, a History and Physical                            was performed, and patient medications and                            allergies were reviewed. The patient's tolerance of                            previous anesthesia was also reviewed. The risks                            and benefits of the procedure and the sedation                            options and risks were discussed with the patient.                            All questions were answered, and informed consent                            was obtained. Prior Anticoagulants: The patient has                            taken no anticoagulant or antiplatelet agents                            except for aspirin. ASA Grade Assessment: III - A                            patient with severe systemic disease. After                            reviewing the risks and benefits, the patient was  deemed in satisfactory condition to undergo the                            procedure.                           After obtaining informed consent, the colonoscope                            was passed under direct vision. Throughout the                            procedure, the patient's blood pressure, pulse, and                            oxygen saturations were monitored continuously. The                             Olympus CF-HQ190L [id number]) Colonoscope was                            introduced through the anus and advanced to the 3                            cm into the ileum. The colonoscopy was performed                            without difficulty. The patient tolerated the                            procedure. The quality of the bowel preparation was                            adequate. The terminal ileum, ileocecal valve,                            appendiceal orifice, and rectum were photographed. Scope In: 9:11:59 AM Scope Out: 9:27:31 AM Scope Withdrawal Time: 0 hours 11 minutes 20 seconds  Total Procedure Duration: 0 hours 15 minutes 32 seconds  Findings:                 The digital rectal exam findings include                            hemorrhoids. Pertinent negatives include no                            palpable rectal lesions.                           The terminal ileum and ileocecal valve appeared                            normal.  There was a medium-sized lipoma, at the hepatic                            flexure.                           Many medium-mouthed and small-mouthed diverticula                            were found in the recto-sigmoid colon, sigmoid                            colon, descending colon and transverse colon.                           Normal mucosa was found in the entire colon.                            Biopsies were taken with a cold forceps for                            histology. Biopsies were taken with a cold forceps                            for histology. Biopsies were taken with a cold                            forceps for histology.                           Non-bleeding non-thrombosed external and internal                            hemorrhoids were found during retroflexion, during                            perianal exam and during digital exam. The                            hemorrhoids were Grade II  (internal hemorrhoids                            that prolapse but reduce spontaneously). Complications:            No immediate complications. Estimated Blood Loss:     Estimated blood loss was minimal. Impression:               - Hemorrhoids found on digital rectal exam.                           - The examined portion of the ileum was normal.                           - Medium-sized lipoma at the hepatic flexure.                           -  Diverticulosis in the recto-sigmoid colon, in the                            sigmoid colon, in the descending colon and in the                            transverse colon.                           - Normal mucosa in the entire examined colon.                            Biopsied.                           - Non-bleeding non-thrombosed external and internal                            hemorrhoids. Recommendation:           - The patient will be observed post-procedure,                            until all discharge criteria are met.                           - Discharge patient to home.                           - Patient has a contact number available for                            emergencies. The signs and symptoms of potential                            delayed complications were discussed with the                            patient. Return to normal activities tomorrow.                            Written discharge instructions were provided to the                            patient.                           - High fiber diet.                           - Use FiberCon 1-2 tablets PO daily.                           - Continue present medications.                           - Await pathology results.                           -  Repeat colonoscopy in 2/3/4 years for                            surveillance and continued remission of IBD and                            also based on patient's age and medical                            comorbidities at  that time (will discuss more in                            clinic to determine follow-up).                           - The findings and recommendations were discussed                            with the patient.                           - The findings and recommendations were discussed                            with the designated responsible adult. Corliss Parish, MD 04/29/2023 9:33:33 AM

## 2023-04-29 NOTE — Progress Notes (Signed)
Pt's states no medical or surgical changes since previsit or office visit. 

## 2023-04-29 NOTE — Progress Notes (Signed)
Sedate, gd SR, tolerated procedure well, VSS, report to RN 

## 2023-04-30 ENCOUNTER — Telehealth: Payer: Self-pay

## 2023-04-30 NOTE — Telephone Encounter (Signed)
  Follow up Call-     04/29/2023    8:29 AM  Call back number  Post procedure Call Back phone  # [phone]  Permission to leave phone message Yes     Patient questions:  Do you have a fever, pain , or abdominal swelling? No. Pain Score  0 *  Have you tolerated food without any problems? Yes.    Have you been able to return to your normal activities? Yes.    Do you have any questions about your discharge instructions: Diet   No. Medications  No. Follow up visit  No.  Do you have questions or concerns about your Care? No.  Actions: * If pain score is 4 or above: No action needed, pain <4.

## 2023-05-02 ENCOUNTER — Encounter: Payer: Self-pay | Admitting: Gastroenterology

## 2023-05-02 ENCOUNTER — Other Ambulatory Visit: Payer: Self-pay

## 2023-05-02 DIAGNOSIS — K5150 Left sided colitis without complications: Secondary | ICD-10-CM

## 2023-05-02 DIAGNOSIS — K529 Noninfective gastroenteritis and colitis, unspecified: Secondary | ICD-10-CM

## 2023-05-29 ENCOUNTER — Other Ambulatory Visit: Payer: Self-pay | Admitting: Family Medicine

## 2023-05-29 DIAGNOSIS — Z8679 Personal history of other diseases of the circulatory system: Secondary | ICD-10-CM

## 2023-06-06 ENCOUNTER — Ambulatory Visit (INDEPENDENT_AMBULATORY_CARE_PROVIDER_SITE_OTHER): Payer: Medicare Other | Admitting: Sports Medicine

## 2023-06-06 ENCOUNTER — Other Ambulatory Visit (INDEPENDENT_AMBULATORY_CARE_PROVIDER_SITE_OTHER): Payer: Medicare Other

## 2023-06-06 ENCOUNTER — Encounter: Payer: Self-pay | Admitting: Sports Medicine

## 2023-06-06 DIAGNOSIS — M170 Bilateral primary osteoarthritis of knee: Secondary | ICD-10-CM

## 2023-06-06 NOTE — Progress Notes (Signed)
    Procedures performed today:    Procedure: Real-time Ultrasound Guided injection of the left knee Device: Samsung HS60  Verbal informed consent obtained.  Time-out conducted.  Noted no overlying erythema, induration, or other signs of local infection.  Skin prepped in a sterile fashion.  Local anesthesia: Topical Ethyl chloride.  With sterile technique and under real time ultrasound guidance: Mild effusion noted, 1 cc Kenalog 40, 2 cc lidocaine, 2 cc bupivacaine injected easily Completed without difficulty  Advised to call if fevers/chills, erythema, induration, drainage, or persistent bleeding.  Images permanently stored and available for review in PACS.  Impression: Technically successful ultrasound guided injection.  Procedure: Real-time Ultrasound Guided injection of the right knee Device: Samsung HS60  Verbal informed consent obtained.  Time-out conducted.  Noted no overlying erythema, induration, or other signs of local infection.  Skin prepped in a sterile fashion.  Local anesthesia: Topical Ethyl chloride.  With sterile technique and under real time ultrasound guidance: Mild effusion noted, 1 cc Kenalog 40, 2 cc lidocaine, 2 cc bupivacaine injected easily Completed without difficulty  Advised to call if fevers/chills, erythema, induration, drainage, or persistent bleeding.  Images permanently stored and available for review in PACS.  Impression: Technically successful ultrasound guided injection.  Independent interpretation of notes and tests performed by another provider:   None.  Brief History, Exam, Impression, and Recommendations:    Primary osteoarthritis of both knees Known osteoarthritis, avoiding meloxicam and ibuprofen as he did have a GI bleed, on Celebrex, we injected him back in May he did well, we repeated bilateral injections today, return to see me as needed. Along the way I am happy to do Visco if he  desires.    ____________________________________________ Ihor Austin. [name], M.D., ABFM., CAQSM., AME. Primary Care and Sports Medicine [facility]  Adjunct Professor of Family Medicine  [employer]  Restaurant manager, fast food

## 2023-06-06 NOTE — Assessment & Plan Note (Signed)
Known osteoarthritis, avoiding meloxicam and ibuprofen as he did have a GI bleed, on Celebrex, we injected him back in May he did well, we repeated bilateral injections today, return to see me as needed. Along the way I am happy to do Visco if he desires.

## 2023-06-10 DIAGNOSIS — M170 Bilateral primary osteoarthritis of knee: Secondary | ICD-10-CM | POA: Diagnosis not present

## 2023-06-10 MED ORDER — TRIAMCINOLONE ACETONIDE 40 MG/ML IJ SUSP
40.0000 mg | Freq: Once | INTRAMUSCULAR | Status: AC
Start: 2023-06-10 — End: 2023-06-10
  Administered 2023-06-10: 40 mg via INTRAMUSCULAR

## 2023-06-10 NOTE — Addendum Note (Signed)
Addended by: Carren Rang A on: 06/10/2023 09:06 AM   Modules accepted: Orders

## 2023-08-09 ENCOUNTER — Other Ambulatory Visit: Payer: Self-pay | Admitting: Gastroenterology

## 2023-08-09 ENCOUNTER — Other Ambulatory Visit: Payer: Self-pay | Admitting: Family Medicine

## 2023-08-09 DIAGNOSIS — Z8546 Personal history of malignant neoplasm of prostate: Secondary | ICD-10-CM

## 2023-08-09 DIAGNOSIS — R351 Nocturia: Secondary | ICD-10-CM

## 2023-08-12 NOTE — Telephone Encounter (Signed)
Patient scheduled for 11/21 for physical, thanks.

## 2023-08-15 ENCOUNTER — Encounter: Payer: Self-pay | Admitting: Gastroenterology

## 2023-08-19 ENCOUNTER — Ambulatory Visit: Payer: Medicare Other | Admitting: Family Medicine

## 2023-08-21 ENCOUNTER — Ambulatory Visit: Payer: Medicare Other | Attending: Family Medicine

## 2023-08-21 ENCOUNTER — Encounter: Payer: Self-pay | Admitting: Family Medicine

## 2023-08-21 ENCOUNTER — Ambulatory Visit (INDEPENDENT_AMBULATORY_CARE_PROVIDER_SITE_OTHER): Payer: Medicare Other | Admitting: Family Medicine

## 2023-08-21 VITALS — BP 138/76 | HR 77 | Ht 72.0 in | Wt 208.0 lb

## 2023-08-21 DIAGNOSIS — I10 Essential (primary) hypertension: Secondary | ICD-10-CM | POA: Diagnosis not present

## 2023-08-21 DIAGNOSIS — I471 Supraventricular tachycardia, unspecified: Secondary | ICD-10-CM

## 2023-08-21 DIAGNOSIS — Z23 Encounter for immunization: Secondary | ICD-10-CM | POA: Diagnosis not present

## 2023-08-21 DIAGNOSIS — R002 Palpitations: Secondary | ICD-10-CM | POA: Diagnosis not present

## 2023-08-21 DIAGNOSIS — Z0000 Encounter for general adult medical examination without abnormal findings: Secondary | ICD-10-CM | POA: Insufficient documentation

## 2023-08-21 DIAGNOSIS — E785 Hyperlipidemia, unspecified: Secondary | ICD-10-CM

## 2023-08-21 NOTE — Assessment & Plan Note (Signed)
He has remained on diltiazem but has noticed increase palpitations at night.  Zio patch ordered.

## 2023-08-21 NOTE — Assessment & Plan Note (Addendum)
Well adult  Orders Placed This Encounter  Procedures   CMP14+EGFR   CBC with Differential/Platelet   Lipid Panel With LDL/HDL Ratio   TSH   Magnesium  Immunizations:  COVID vaccine Screenings: UTD Anticipatory guidance/Risk factor reduction:  Recommendations per AVS.

## 2023-08-21 NOTE — Progress Notes (Unsigned)
EP to read

## 2023-08-21 NOTE — Patient Instructions (Signed)
Preventive Care 65 Years and Older, Male Preventive care refers to lifestyle choices and visits with your health care provider that can promote health and wellness. Preventive care visits are also called wellness exams. What can I expect for my preventive care visit? Counseling During your preventive care visit, your health care provider may ask about your: Medical history, including: Past medical problems. Family medical history. History of falls. Current health, including: Emotional well-being. Home life and relationship well-being. Sexual activity. Memory and ability to understand (cognition). Lifestyle, including: Alcohol, nicotine or tobacco, and drug use. Access to firearms. Diet, exercise, and sleep habits. Work and work environment. Sunscreen use. Safety issues such as seatbelt and bike helmet use. Physical exam Your health care provider will check your: Height and weight. These may be used to calculate your BMI (body mass index). BMI is a measurement that tells if you are at a healthy weight. Waist circumference. This measures the distance around your waistline. This measurement also tells if you are at a healthy weight and may help predict your risk of certain diseases, such as type 2 diabetes and high blood pressure. Heart rate and blood pressure. Body temperature. Skin for abnormal spots. What immunizations do I need?  Vaccines are usually given at various ages, according to a schedule. Your health care provider will recommend vaccines for you based on your age, medical history, and lifestyle or other factors, such as travel or where you work. What tests do I need? Screening Your health care provider may recommend screening tests for certain conditions. This may include: Lipid and cholesterol levels. Diabetes screening. This is done by checking your blood sugar (glucose) after you have not eaten for a while (fasting). Hepatitis C test. Hepatitis B test. HIV (human  immunodeficiency virus) test. STI (sexually transmitted infection) testing, if you are at risk. Lung cancer screening. Colorectal cancer screening. Prostate cancer screening. Abdominal aortic aneurysm (AAA) screening. You may need this if you are a current or former smoker. Talk with your health care provider about your test results, treatment options, and if necessary, the need for more tests. Follow these instructions at home: Eating and drinking  Eat a diet that includes fresh fruits and vegetables, whole grains, lean protein, and low-fat dairy products. Limit your intake of foods with high amounts of sugar, saturated fats, and salt. Take vitamin and mineral supplements as recommended by your health care provider. Do not drink alcohol if your health care provider tells you not to drink. If you drink alcohol: Limit how much you have to 0-2 drinks a day. Know how much alcohol is in your drink. In the [place] one drink equals one 12 oz bottle of beer (355 mL), one 5 oz glass of wine (148 mL), or one 1 oz glass of hard liquor (44 mL). Lifestyle Brush your teeth every morning and night with fluoride toothpaste. Floss one time each day. Exercise for at least 30 minutes 5 or more days each week. Do not use any products that contain nicotine or tobacco. These products include cigarettes, chewing tobacco, and vaping devices, such as e-cigarettes. If you need help quitting, ask your health care provider. Do not use drugs. If you are sexually active, practice safe sex. Use a condom or other form of protection to prevent STIs. Take aspirin only as told by your health care provider. Make sure that you understand how much to take and what form to take. Work with your health care provider to find out whether it is safe   and beneficial for you to take aspirin daily. Ask your health care provider if you need to take a cholesterol-lowering medicine (statin). Find healthy ways to manage stress, such  as: Meditation, yoga, or listening to music. Journaling. Talking to a trusted person. Spending time with friends and family. Safety Always wear your seat belt while driving or riding in a vehicle. Do not drive: If you have been drinking alcohol. Do not ride with someone who has been drinking. When you are tired or distracted. While texting. If you have been using any mind-altering substances or drugs. Wear a helmet and other protective equipment during sports activities. If you have firearms in your house, make sure you follow all gun safety procedures. Minimize exposure to UV radiation to reduce your risk of skin cancer. What's next? Visit your health care provider once a year for an annual wellness visit. Ask your health care provider how often you should have your eyes and teeth checked. Stay up to date on all vaccines. This information is not intended to replace advice given to you by your health care provider. Make sure you discuss any questions you have with your health care provider. Document Revised: 03/14/2021 Document Reviewed: 03/14/2021 Elsevier Patient Education  2024 Elsevier Inc.  
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
## 2023-08-22 LAB — CBC WITH DIFFERENTIAL/PLATELET
Basophils Absolute: 0 10*3/uL (ref 0.0–0.2)
Basos: 0 %
EOS (ABSOLUTE): 0.2 10*3/uL (ref 0.0–0.4)
Eos: 4 %
Hematocrit: 40.8 % (ref 37.5–51.0)
Hemoglobin: 13.5 g/dL (ref 13.0–17.7)
Immature Grans (Abs): 0 10*3/uL (ref 0.0–0.1)
Immature Granulocytes: 0 %
Lymphocytes Absolute: 1.9 10*3/uL (ref 0.7–3.1)
Lymphs: 27 %
MCH: 31.1 pg (ref 26.6–33.0)
MCHC: 33.1 g/dL (ref 31.5–35.7)
MCV: 94 fL (ref 79–97)
Monocytes Absolute: 0.7 10*3/uL (ref 0.1–0.9)
Monocytes: 10 %
Neutrophils Absolute: 4 10*3/uL (ref 1.4–7.0)
Neutrophils: 59 %
Platelets: 189 10*3/uL (ref 150–450)
RBC: 4.34 x10E6/uL (ref 4.14–5.80)
RDW: 12 % (ref 11.6–15.4)
WBC: 6.8 10*3/uL (ref 3.4–10.8)

## 2023-08-22 LAB — CMP14+EGFR
ALT: 15 [IU]/L (ref 0–44)
AST: 19 [IU]/L (ref 0–40)
Albumin: 4.1 g/dL (ref 3.8–4.8)
Alkaline Phosphatase: 93 [IU]/L (ref 44–121)
BUN/Creatinine Ratio: 17 (ref 10–24)
BUN: 15 mg/dL (ref 8–27)
Bilirubin Total: 0.2 mg/dL (ref 0.0–1.2)
CO2: 23 mmol/L (ref 20–29)
Calcium: 8.3 mg/dL — ABNORMAL LOW (ref 8.6–10.2)
Chloride: 105 mmol/L (ref 96–106)
Creatinine, Ser: 0.86 mg/dL (ref 0.76–1.27)
Globulin, Total: 1.6 g/dL (ref 1.5–4.5)
Glucose: 82 mg/dL (ref 70–99)
Potassium: 4.5 mmol/L (ref 3.5–5.2)
Sodium: 144 mmol/L (ref 134–144)
Total Protein: 5.7 g/dL — ABNORMAL LOW (ref 6.0–8.5)
eGFR: 88 mL/min/{1.73_m2} (ref >59)

## 2023-08-22 LAB — MAGNESIUM: Magnesium: 1.8 mg/dL (ref 1.6–2.3)

## 2023-08-22 LAB — LIPID PANEL WITH LDL/HDL RATIO
Cholesterol, Total: 162 mg/dL (ref 100–199)
HDL: 53 mg/dL (ref >39)
LDL Chol Calc (NIH): 89 mg/dL (ref 0–99)
LDL/HDL Ratio: 1.7 ratio (ref 0.0–3.6)
Triglycerides: 110 mg/dL (ref 0–149)
VLDL Cholesterol Cal: 20 mg/dL (ref 5–40)

## 2023-08-22 LAB — TSH: TSH: 2.17 u[IU]/mL (ref 0.450–4.500)

## 2023-09-02 ENCOUNTER — Telehealth: Payer: Self-pay

## 2023-09-02 NOTE — Telephone Encounter (Signed)
Patient called with symptoms of diarrhea and some vomiting. Denies chronic diarrhea, family members with diarrhea, recent travel to a foreign country, fever unresponsive to fever-reducing measures, new medications or blood in stool.   He did go to the beach over the holiday. Sunday he had a protein drink for dinner which is a change in his normal diet.   He is feeling much better today. He is drinking plenty of fluids to stay hydrated.   Advised of clear liquid diet first 12 to 24 hours. Sips of water, flat soda, clear broth, gelatin (not red) or flavored ice.   Next 12 hours, progress to eating soups (avoiding cream soups), dry toast, soda crackers, white rice, pretzels, bananas, applesauce and potatoes.  Progress to a regular diet after soft formed stools occur.   Avoid dairy products, citrus juices, raw fruits and vegetables, and fried or spicy foods for 2 to 5 days after diarrhea subsides.   After 6 hours of diarrhea and cramping, or if pain persists, OTC antidiarrheal medications (Imodium, Kaopectate, Pepto-Bismol) can be used. Follow the instructions on the label.

## 2023-09-02 NOTE — Telephone Encounter (Signed)
Copied from CRM [id number]. Topic: Clinical - Medical Advice >> Sep 01, 2023  4:00 PM Amy B wrote: Reason for CRM: Patient states he has a stomach bug with vomiting and diarrhea.  He requests a prescription be called in to his pharmacy or a call back to discuss, [phone].

## 2023-10-21 ENCOUNTER — Ambulatory Visit: Payer: Medicare Other

## 2023-10-23 ENCOUNTER — Ambulatory Visit (INDEPENDENT_AMBULATORY_CARE_PROVIDER_SITE_OTHER): Payer: Medicare Other | Admitting: Sports Medicine

## 2023-10-23 ENCOUNTER — Other Ambulatory Visit (INDEPENDENT_AMBULATORY_CARE_PROVIDER_SITE_OTHER): Payer: Medicare Other

## 2023-10-23 DIAGNOSIS — M170 Bilateral primary osteoarthritis of knee: Secondary | ICD-10-CM | POA: Diagnosis not present

## 2023-10-23 MED ORDER — TRIAMCINOLONE ACETONIDE 40 MG/ML IJ SUSP
80.0000 mg | Freq: Once | INTRAMUSCULAR | Status: AC
  Administered 2023-10-23: 80 mg via INTRAMUSCULAR

## 2023-10-23 NOTE — Progress Notes (Signed)
    Procedures performed today:    Procedure: Real-time Ultrasound Guided injection of the left knee Device: Samsung HS60  Verbal informed consent obtained.  Time-out conducted.  Noted no overlying erythema, induration, or other signs of local infection.  Skin prepped in a sterile fashion.  Local anesthesia: Topical Ethyl chloride.  With sterile technique and under real time ultrasound guidance: Mild effusion noted, 1 cc Kenalog 40, 2 cc lidocaine, 2 cc bupivacaine injected easily Completed without difficulty  Advised to call if fevers/chills, erythema, induration, drainage, or persistent bleeding.  Images permanently stored and available for review in PACS.  Impression: Technically successful ultrasound guided injection.   Procedure: Real-time Ultrasound Guided injection of the right knee Device: Samsung HS60  Verbal informed consent obtained.  Time-out conducted.  Noted no overlying erythema, induration, or other signs of local infection.  Skin prepped in a sterile fashion.  Local anesthesia: Topical Ethyl chloride.  With sterile technique and under real time ultrasound guidance: Mild effusion noted, 1 cc Kenalog 40, 2 cc lidocaine, 2 cc bupivacaine injected easily Completed without difficulty  Advised to call if fevers/chills, erythema, induration, drainage, or persistent bleeding.  Images permanently stored and available for review in PACS.  Impression: Technically successful ultrasound guided injection.  Independent interpretation of notes and tests performed by another provider:   None.  Brief History, Exam, Impression, and Recommendations:    Primary osteoarthritis of both knees Pleasant 83 year old male, known knee osteoarthritis, avoiding meloxicam and ibuprofen as he does have a history of GI bleed, currently on Celebrex, last injection was in September, repeated today, return as needed.    ____________________________________________ Ihor Austin. [name], M.D.,  ABFM., CAQSM., AME. Primary Care and Sports Medicine [facility]  Adjunct Professor of Family Medicine  [place]  Restaurant manager, fast food

## 2023-10-23 NOTE — Addendum Note (Signed)
Addended by: Holland Falling on: 10/23/2023 04:35 PM   Modules accepted: Orders

## 2023-10-23 NOTE — Code Documentation (Signed)
done

## 2023-10-23 NOTE — Assessment & Plan Note (Signed)
Pleasant 83 year old male, known knee osteoarthritis, avoiding meloxicam and ibuprofen as he does have a history of GI bleed, currently on Celebrex, last injection was in September, repeated today, return as needed.

## 2023-11-04 ENCOUNTER — Ambulatory Visit: Payer: Medicare Other

## 2023-11-04 VITALS — Ht 72.0 in | Wt 200.0 lb

## 2023-11-04 DIAGNOSIS — Z0000 Encounter for general adult medical examination without abnormal findings: Secondary | ICD-10-CM

## 2023-11-04 NOTE — Patient Instructions (Signed)
  [name] , Thank you for taking time to come for your Medicare Wellness Visit. I appreciate your ongoing commitment to your health goals. Please review the following plan we discussed and let me know if I can assist you in the future.   These are the goals we discussed:  Goals       DIET - INCREASE WATER  INTAKE      He would like to continue with the increase water  intake.        DIET - REDUCE SALT INTAKE TO 2 GRAMS PER DAY OR LESS      Eating a diet rich in vegetables, fruits and whole grains: also includes low-fat dairy products, poultry, fish, legumes, and nuts; limit intake of sweets, sugar-sweetened beverages and red meats       Exercise 3-4 x per week for 20 mins      Physical Activity Recommendations for modifying lipids and lowering blood pressure Engage in aerobic physical activity to reduce LDL-cholesterol, non-HDL-cholesterol, and blood pressure Frequency: 3-4 sessions per week Intensity: moderate to vigorous Duration: 40 minutes on average  1. Aerobic exercise Frequency: 3-5 sessions per week Intensity: 50-80% capacity Duration: 20 - 60 minutes Examples: walking, treadmill, cycling, rowing, stair climbing, and arm/leg ergometry  2. Resistance exercise Frequency: 2-3 sessions per week Intensity: 10-15 repetitions/set to moderate fatigue Duration: 1-3 sets of 8-10 upper and lower body exercises Examples: calisthenics, elastic bands, cuff/hand weights, dumbbels, free weights, wall pulleys, and weight machines      Patient Stated (pt-stated)      10/26/2021 AWV Goal: Improved Nutrition/Diet  Patient will verbalize understanding that diet plays an important role in overall health and that a poor diet is a risk factor for many chronic medical conditions.  Over the next year, patient will improve self management of their diet by incorporating more water . Patient will utilize available community resources to help with food acquisition if needed (ex: food pantries, [address], etc) Patient will work with nutrition specialist if a referral was made       Patient Stated (pt-stated)      11/01/2022 AWV Goal: Improved Nutrition/Diet  Patient will verbalize understanding that diet plays an important role in overall health and that a poor diet is a risk factor for many chronic medical conditions.  Over the next year, patient will improve self management of their diet by incorporating more water . Patient will utilize available community resources to help with food acquisition if needed (ex: food pantries, [address], etc) Patient will work with nutrition specialist if a referral was made         This is a list of the screening recommended for you and due dates:  Health Maintenance  Topic Date Due   Medicare Annual Wellness Visit  11/03/2024   DTaP/Tdap/Td vaccine (3 - Td or Tdap) 11/06/2024   Colon Cancer Screening  04/28/2025   Pneumonia Vaccine  Completed   Flu Shot  Completed   COVID-19 Vaccine  Completed   Zoster (Shingles) Vaccine  Completed   HPV Vaccine  Aged Out   Hepatitis C Screening  Discontinued
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
## 2023-11-06 ENCOUNTER — Other Ambulatory Visit: Payer: Self-pay | Admitting: Gastroenterology

## 2023-12-10 ENCOUNTER — Encounter: Payer: Self-pay | Admitting: Gastroenterology

## 2023-12-10 ENCOUNTER — Other Ambulatory Visit (INDEPENDENT_AMBULATORY_CARE_PROVIDER_SITE_OTHER)

## 2023-12-10 ENCOUNTER — Ambulatory Visit: Payer: Medicare Other | Admitting: Gastroenterology

## 2023-12-10 VITALS — BP 128/72 | HR 65 | Ht 72.0 in | Wt 210.1 lb

## 2023-12-10 DIAGNOSIS — R933 Abnormal findings on diagnostic imaging of other parts of digestive tract: Secondary | ICD-10-CM | POA: Diagnosis not present

## 2023-12-10 DIAGNOSIS — K5150 Left sided colitis without complications: Secondary | ICD-10-CM | POA: Diagnosis not present

## 2023-12-10 DIAGNOSIS — K5180 Other ulcerative colitis without complications: Secondary | ICD-10-CM | POA: Diagnosis not present

## 2023-12-10 DIAGNOSIS — K529 Noninfective gastroenteritis and colitis, unspecified: Secondary | ICD-10-CM

## 2023-12-10 LAB — CBC
HCT: 44.1 % (ref 39.0–52.0)
Hemoglobin: 14.6 g/dL (ref 13.0–17.0)
MCHC: 33.2 g/dL (ref 30.0–36.0)
MCV: 95.7 fl (ref 78.0–100.0)
Platelets: 195 10*3/uL (ref 150.0–400.0)
RBC: 4.6 Mil/uL (ref 4.22–5.81)
RDW: 13.9 % (ref 11.5–15.5)
WBC: 5.7 10*3/uL (ref 4.0–10.5)

## 2023-12-10 LAB — SEDIMENTATION RATE: Sed Rate: 13 mm/h (ref 0–20)

## 2023-12-10 LAB — COMPREHENSIVE METABOLIC PANEL
ALT: 16 U/L (ref 0–53)
AST: 19 U/L (ref 0–37)
Albumin: 4.2 g/dL (ref 3.5–5.2)
Alkaline Phosphatase: 78 U/L (ref 39–117)
BUN: 17 mg/dL (ref 6–23)
CO2: 25 meq/L (ref 19–32)
Calcium: 9.2 mg/dL (ref 8.4–10.5)
Chloride: 105 meq/L (ref 96–112)
Creatinine, Ser: 0.83 mg/dL (ref 0.40–1.50)
GFR: 82.58 mL/min (ref >60.00)
Glucose, Bld: 93 mg/dL (ref 70–99)
Potassium: 4.6 meq/L (ref 3.5–5.1)
Sodium: 140 meq/L (ref 135–145)
Total Bilirubin: 0.6 mg/dL (ref 0.2–1.2)
Total Protein: 6.3 g/dL (ref 6.0–8.3)

## 2023-12-10 LAB — C-REACTIVE PROTEIN: CRP: <1 mg/dL (ref 0.5–20.0)

## 2023-12-10 NOTE — Patient Instructions (Signed)
 Your provider has requested that you go to the basement level for lab work before leaving today. Press "B" on the elevator. The lab is located at the first door on the left as you exit the elevator.  _______________________________________________________  If your blood pressure at your visit was 140/90 or greater, please contact your primary care physician to follow up on this.  _______________________________________________________  If you are age 81 or older, your body mass index should be between 23-30. Your Body mass index is 28.5 kg/m. If this is out of the aforementioned range listed, please consider follow up with your Primary Care Provider.  If you are age 48 or younger, your body mass index should be between 19-25. Your Body mass index is 28.5 kg/m. If this is out of the aformentioned range listed, please consider follow up with your Primary Care Provider.   ________________________________________________________  The [facility] providers would like to encourage you to use [facility] to communicate with providers for non-urgent requests or questions.  Due to long hold times on the telephone, sending your provider a message by [facility] may be a faster and more efficient way to get a response.  Please allow 48 business hours for a response.  Please remember that this is for non-urgent requests.  _______________________________________________________  Due to recent changes in healthcare laws, you may see the results of your imaging and laboratory studies on MyChart before your provider has had a chance to review them.  We understand that in some cases there may be results that are confusing or concerning to you. Not all laboratory results come back in the same time frame and the provider may be waiting for multiple results in order to interpret others.  Please give [place] 48 hours in order for your provider to thoroughly review all the results before contacting the office for clarification of  your results.   Thank you for choosing me and [facility].  Dr. Meridee Score
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
## 2023-12-12 ENCOUNTER — Other Ambulatory Visit

## 2023-12-12 ENCOUNTER — Encounter: Payer: Self-pay | Admitting: Gastroenterology

## 2023-12-12 DIAGNOSIS — K5180 Other ulcerative colitis without complications: Secondary | ICD-10-CM

## 2023-12-18 LAB — CALPROTECTIN: Calprotectin: 126 ug/g — ABNORMAL HIGH

## 2023-12-22 DIAGNOSIS — H903 Sensorineural hearing loss, bilateral: Secondary | ICD-10-CM | POA: Diagnosis not present

## 2023-12-25 ENCOUNTER — Other Ambulatory Visit: Payer: Self-pay

## 2023-12-25 DIAGNOSIS — K5150 Left sided colitis without complications: Secondary | ICD-10-CM

## 2024-02-02 ENCOUNTER — Ambulatory Visit (INDEPENDENT_AMBULATORY_CARE_PROVIDER_SITE_OTHER): Admitting: Sports Medicine

## 2024-02-02 ENCOUNTER — Other Ambulatory Visit (INDEPENDENT_AMBULATORY_CARE_PROVIDER_SITE_OTHER)

## 2024-02-02 DIAGNOSIS — M170 Bilateral primary osteoarthritis of knee: Secondary | ICD-10-CM | POA: Diagnosis not present

## 2024-02-02 MED ORDER — TRIAMCINOLONE ACETONIDE 40 MG/ML IJ SUSP
80.0000 mg | Freq: Once | INTRAMUSCULAR | Status: AC
Start: 2024-02-02 — End: 2024-02-02
  Administered 2024-02-02: 80 mg via INTRAMUSCULAR

## 2024-02-02 NOTE — Addendum Note (Signed)
 Addended by: Burton Casey L on: 02/02/2024 11:20 AM   Modules accepted: Orders

## 2024-02-02 NOTE — Progress Notes (Signed)
    Procedures performed today:    Procedure: Real-time Ultrasound Guided injection of the left knee Device: Samsung HS60  Verbal informed consent obtained.  Time-out conducted.  Noted no overlying erythema, induration, or other signs of local infection.  Skin prepped in a sterile fashion.  Local anesthesia: Topical Ethyl chloride.  With sterile technique and under real time ultrasound guidance: Mild effusion noted, 1 cc Kenalog  40, 2 cc lidocaine , 2 cc bupivacaine injected easily Completed without difficulty  Advised to call if fevers/chills, erythema, induration, drainage, or persistent bleeding.  Images permanently stored and available for review in PACS.  Impression: Technically successful ultrasound guided injection.   Procedure: Real-time Ultrasound Guided injection of the right knee Device: Samsung HS60  Verbal informed consent obtained.  Time-out conducted.  Noted no overlying erythema, induration, or other signs of local infection.  Skin prepped in a sterile fashion.  Local anesthesia: Topical Ethyl chloride.  With sterile technique and under real time ultrasound guidance: Mild effusion noted, 1 cc Kenalog  40, 2 cc lidocaine , 2 cc bupivacaine injected easily Completed without difficulty  Advised to call if fevers/chills, erythema, induration, drainage, or persistent bleeding.  Images permanently stored and available for review in PACS.  Impression: Technically successful ultrasound guided injection.  Independent interpretation of notes and tests performed by another provider:   None.  Brief History, Exam, Impression, and Recommendations:    Primary osteoarthritis of both knees Very pleasant 83 year old male, bilateral knee osteoarthritis, we have avoided NSAIDs due to history of GI bleed, currently on Celebrex , last injection was in January 2025, repeat bilateral injection today, return to see me as needed.    ____________________________________________ Joselyn Nicely.  Sandy Crumb, M.D., ABFM., CAQSM., AME. Primary Care and Sports Medicine [facility]  Adjunct Professor of [facility] Medicine  [employer]  Restaurant manager, fast food

## 2024-02-02 NOTE — Assessment & Plan Note (Signed)
 Very pleasant 83 year old male, bilateral knee osteoarthritis, we have avoided NSAIDs due to history of GI bleed, currently on Celebrex , last injection was in January 2025, repeat bilateral injection today, return to see me as needed.

## 2024-02-06 ENCOUNTER — Other Ambulatory Visit: Payer: Self-pay

## 2024-02-06 DIAGNOSIS — Z8546 Personal history of malignant neoplasm of prostate: Secondary | ICD-10-CM

## 2024-02-06 DIAGNOSIS — R351 Nocturia: Secondary | ICD-10-CM

## 2024-02-06 MED ORDER — TAMSULOSIN HCL 0.4 MG PO CAPS: ORAL_CAPSULE | ORAL | 1 refills | Status: DC

## 2024-02-06 NOTE — Progress Notes (Signed)
 Called patient left detailed message to call office back to schedule a 30-month follow up- with Dr. Augustus Ledger [phone] ok to schedule

## 2024-02-06 NOTE — Progress Notes (Signed)
 Pls contact the pt to schedule 87-month follow-up with Dr. Augustus Ledger. Thx

## 2024-02-17 ENCOUNTER — Telehealth: Payer: Self-pay

## 2024-02-17 DIAGNOSIS — R351 Nocturia: Secondary | ICD-10-CM

## 2024-02-17 DIAGNOSIS — Z8546 Personal history of malignant neoplasm of prostate: Secondary | ICD-10-CM

## 2024-02-17 MED ORDER — TAMSULOSIN HCL 0.4 MG PO CAPS: ORAL_CAPSULE | ORAL | 1 refills | Status: DC

## 2024-02-17 NOTE — Telephone Encounter (Signed)
 Patient came into office to get medication refill on the following medication tamsulosin  (FLOMAX ) 0.4 MG CAPS capsule [[id number]], patient's pharmacy changed,please advise, thanks.

## 2024-02-17 NOTE — Addendum Note (Signed)
 Addended by: Herb Beltre E on: 02/17/2024 04:37 PM   Modules accepted: Orders

## 2024-03-08 ENCOUNTER — Telehealth: Payer: Self-pay | Admitting: Gastroenterology

## 2024-03-08 MED ORDER — MESALAMINE 1.2 G PO TBEC: DELAYED_RELEASE_TABLET | ORAL | 1 refills | Status: DC

## 2024-03-08 NOTE — Telephone Encounter (Signed)
 Refill sent.

## 2024-03-08 NOTE — Telephone Encounter (Signed)
 Pt needs a refill for his mesalamine  med. [facility] [place]

## 2024-03-29 ENCOUNTER — Telehealth: Payer: Self-pay | Admitting: Gastroenterology

## 2024-03-29 NOTE — Telephone Encounter (Signed)
 The pt has been advised that the order has already been entered and he can come as he is able for fecal calprotectin.

## 2024-03-29 NOTE — Telephone Encounter (Signed)
 Patient stated that he would like to go ahead an get an order for another stool sample kit. Patient stated that he would like a call informing him when he is able to come pick up the stool kit. Please advise.

## 2024-04-08 ENCOUNTER — Other Ambulatory Visit

## 2024-04-16 ENCOUNTER — Other Ambulatory Visit

## 2024-04-16 DIAGNOSIS — K5150 Left sided colitis without complications: Secondary | ICD-10-CM | POA: Diagnosis not present

## 2024-04-23 ENCOUNTER — Ambulatory Visit: Payer: Self-pay | Admitting: Gastroenterology

## 2024-04-23 DIAGNOSIS — K5180 Other ulcerative colitis without complications: Secondary | ICD-10-CM

## 2024-04-23 LAB — CALPROTECTIN: Calprotectin: 159 ug/g — ABNORMAL HIGH

## 2024-04-23 MED ORDER — PREDNISONE 10 MG PO TABS: ORAL_TABLET | ORAL | 0 refills | Status: AC

## 2024-04-26 NOTE — Telephone Encounter (Signed)
 Attempted to reach patient. No answer, left VM for patient to return call.

## 2024-04-27 ENCOUNTER — Encounter: Payer: Self-pay | Admitting: Sports Medicine

## 2024-04-27 ENCOUNTER — Telehealth: Payer: Self-pay | Admitting: Sports Medicine

## 2024-04-27 ENCOUNTER — Ambulatory Visit (INDEPENDENT_AMBULATORY_CARE_PROVIDER_SITE_OTHER): Admitting: Sports Medicine

## 2024-04-27 DIAGNOSIS — R600 Localized edema: Secondary | ICD-10-CM | POA: Diagnosis not present

## 2024-04-27 DIAGNOSIS — M170 Bilateral primary osteoarthritis of knee: Secondary | ICD-10-CM | POA: Diagnosis not present

## 2024-04-27 NOTE — Telephone Encounter (Signed)
 Bilateral Visco approval please, x-ray confirmed osteoarthritis, failed everything

## 2024-04-27 NOTE — Assessment & Plan Note (Signed)
 Bilateral dependent edema worse at the end of the day per patient report, no red flag symptoms, I have asked him to discuss this with his PCP to rule out cardiac, hepatic and renal disease, and to wear knee-high graduated compression stockings in the meantime.

## 2024-04-27 NOTE — Progress Notes (Signed)
    Procedures performed today:    None.  Independent interpretation of notes and tests performed by another provider:   None.  Brief History, Exam, Impression, and Recommendations:    Primary osteoarthritis of both knees Very pleasant 83 year old male, known x-ray confirmed osteoarthritis, last steroid injection was May 5. He had partial relief, he has failed analgesics, NSAIDs, over 6 weeks of physician directed physical therapy, proceeding with Visco approval. When he is approved at the follow-up we can do steroid and Orthovisc both knees.  Bilateral lower extremity edema Bilateral dependent edema worse at the end of the day per patient report, no red flag symptoms, I have asked him to discuss this with his PCP to rule out cardiac, hepatic and renal disease, and to wear knee-high graduated compression stockings in the meantime.    ____________________________________________ Debby PARAS. Curtis, M.D., ABFM., CAQSM., AME. Primary Care and Sports Medicine [facility]  Adjunct Professor of [facility] Medicine  [employer]  Restaurant manager, fast food

## 2024-04-27 NOTE — Assessment & Plan Note (Signed)
 Very pleasant 83 year old male, known x-ray confirmed osteoarthritis, last steroid injection was May 5. He had partial relief, he has failed analgesics, NSAIDs, over 6 weeks of physician directed physical therapy, proceeding with Visco approval. When he is approved at the follow-up we can do steroid and Orthovisc both knees.

## 2024-04-27 NOTE — Telephone Encounter (Signed)
MyVisco paperwork faxed to [employer] at [phone] ?Request is for Orthovisc ?Fax confirmation receipt received  ?

## 2024-04-28 NOTE — Telephone Encounter (Addendum)
 Benefits Investigation Details received from [employer] Injection: Orthovisc/Monovisc PA required: Yes May fill through: Buy and Bill  OV Copay/Coinsurance: % Product Copay: 20% Administration Coinsurance: % Administration Copay: $20 Deductible: $  Deductible does not apply. Once the OOP has been met, patient is covered at 100%. Only one copay will apply for the date of service.  Prior Authorization is required for the drug through [employer]. (SUBMITTED)  PA has been denied for Orthovisc. Insurance company would like for patient to try Synvisc or Durolane.   PA for Synvisc NOT required. Patient is responsible for $160 copay.

## 2024-05-07 NOTE — Telephone Encounter (Signed)
 Contacted patient to advise of an update on gel injections. Unfortunately, he was unable to talk at the moment and he will be returning a call to our office after lunch!

## 2024-05-10 ENCOUNTER — Telehealth: Payer: Self-pay

## 2024-05-10 NOTE — Telephone Encounter (Signed)
 Copied from CRM [id number]. Topic: General - Call Back - No Documentation >> May 10, 2024 12:26 PM Nicholas Garza ORN wrote: Reason for CRM: patient returning missed call stating he thinks it is from Dr. ONEIDA per workflow we are not allowed to work his patients called CAL no answer, please call patient back at  [phone]

## 2024-05-20 ENCOUNTER — Other Ambulatory Visit: Payer: Self-pay | Admitting: Gastroenterology

## 2024-05-21 NOTE — Telephone Encounter (Signed)
 Patient was advised and updated on Synvisc gel injections. Patient is aware of copay and has been scheduled for 06/03/2024.

## 2024-05-25 ENCOUNTER — Telehealth: Payer: Self-pay

## 2024-05-25 ENCOUNTER — Telehealth: Payer: Self-pay | Admitting: Family Medicine

## 2024-05-25 NOTE — Telephone Encounter (Signed)
 Pt called office to inquire about Orthovisc injection appointment. Can you check on Prior auth with Dr. Nikki?

## 2024-05-25 NOTE — Telephone Encounter (Signed)
 Copied from CRM #[id number]. Topic: Clinical - Medication Prior Auth >> May 25, 2024 12:00 PM Corin V wrote: Reason for CRM: Patient returned missed call from office. He was seeing Dr. ONEIDA for his 1st synvisc injection. Please update insurance to new provider and call patient to reschedule appointment.

## 2024-06-01 ENCOUNTER — Encounter: Payer: Self-pay | Admitting: Sports Medicine

## 2024-06-01 NOTE — Telephone Encounter (Signed)
 Patient has been updated, in the previous week, on gel injections. Patient has been scheduled with Dr. De Peru for further treatment.

## 2024-06-02 ENCOUNTER — Telehealth: Payer: Self-pay | Admitting: Gastroenterology

## 2024-06-02 NOTE — Telephone Encounter (Signed)
 Inbound call from patient requesting to speak with nurse states he feels like he has something in his throat. Please advise.

## 2024-06-02 NOTE — Telephone Encounter (Signed)
 Spoke with the pt by phone- he describes a throat irritation that began after he took medication several days ago that did not go down right away.  He now feels like something is moving in his throat.  He has no trouble eating or drinking but has the feeling that something is moving in his throat.  He will continue to take PPI as prescribed as well as other meds and an appt was made with Camie for tomorrow morning at 840 am.  He will go to the ED if he develops trouble breathing or swallowing.

## 2024-06-03 ENCOUNTER — Ambulatory Visit (HOSPITAL_BASED_OUTPATIENT_CLINIC_OR_DEPARTMENT_OTHER): Admitting: Family Medicine

## 2024-06-03 ENCOUNTER — Encounter: Payer: Self-pay | Admitting: Gastroenterology

## 2024-06-03 ENCOUNTER — Other Ambulatory Visit

## 2024-06-03 ENCOUNTER — Ambulatory Visit: Admitting: Gastroenterology

## 2024-06-03 ENCOUNTER — Ambulatory Visit: Admitting: Sports Medicine

## 2024-06-03 VITALS — BP 130/78 | HR 69 | Ht 72.0 in | Wt 207.0 lb

## 2024-06-03 DIAGNOSIS — K219 Gastro-esophageal reflux disease without esophagitis: Secondary | ICD-10-CM

## 2024-06-03 DIAGNOSIS — R09A2 Foreign body sensation, throat: Secondary | ICD-10-CM | POA: Diagnosis not present

## 2024-06-03 DIAGNOSIS — J392 Other diseases of pharynx: Secondary | ICD-10-CM

## 2024-06-03 DIAGNOSIS — K5180 Other ulcerative colitis without complications: Secondary | ICD-10-CM

## 2024-06-03 MED ORDER — OMEPRAZOLE 40 MG PO CPDR: 40.0000 mg | DELAYED_RELEASE_CAPSULE | Freq: Every day | ORAL | 3 refills | Status: DC
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
## 2024-06-03 NOTE — Patient Instructions (Signed)
 _______________________________________________________  If your blood pressure at your visit was 140/90 or greater, please contact your primary care physician to follow up on this.  _______________________________________________________  If you are age 81 or older, your body mass index should be between 23-30. Your Body mass index is 28.07 kg/m. If this is out of the aforementioned range listed, please consider follow up with your Primary Care Provider.  If you are age 5 or younger, your body mass index should be between 19-25. Your Body mass index is 28.07 kg/m. If this is out of the aformentioned range listed, please consider follow up with your Primary Care Provider.   ________________________________________________________  The [facility] providers would like to encourage you to use MYCHART to communicate with providers for non-urgent requests or questions.  Due to long hold times on the telephone, sending your provider a message by [facility] may be a faster and more efficient way to get a response.  Please allow 48 business hours for a response.  Please remember that this is for non-urgent requests.  _______________________________________________________  [facility] is using a team-based approach to care.  Your team is made up of your doctor and two to three APPS. Our APPS (Nurse Practitioners and Physician Assistants) work with your physician to ensure care continuity for you. They are fully qualified to address your health concerns and develop a treatment plan. They communicate directly with your gastroenterologist to care for you. Seeing the Advanced Practice Practitioners on your physician's team can help you by facilitating care more promptly, often allowing for earlier appointments, access to diagnostic testing, procedures, and other specialty referrals.   Your provider has requested that you go to the basement level for lab work before leaving today. Press B on the  elevator. The lab is located at the first door on the left as you exit the elevator.  We have sent the following medications to your pharmacy for you to pick up at your convenience: Prilosec 40mg  daily  You have been referred to ENT please call them in 1 week if you haven't heard anything.  You have been scheduled for an appointment with Camie Furbish PA-C on 07-23-24 at 10am . Please arrive 10 minutes early for your appointment.  You have been scheduled for a Barium Esophogram at [facility] (1st floor of the hospital) on 06-16-24 at 10am. Please arrive 30 minutes prior to your appointment for registration. Make certain not to have anything to eat or drink 3 hours prior to your test. If you need to reschedule for any reason, please contact radiology at [phone] to do so. __________________________________________________________________ A barium swallow is an examination that concentrates on views of the esophagus. This tends to be a double contrast exam (barium and two liquids which, when combined, create a gas to distend the wall of the oesophagus) or single contrast (non-ionic iodine based). The study is usually tailored to your symptoms so a good history is essential. Attention is paid during the study to the form, structure and configuration of the esophagus, looking for functional disorders (such as aspiration, dysphagia, achalasia, motility and reflux) EXAMINATION You may be asked to change into a gown, depending on the type of swallow being performed. A radiologist and radiographer will perform the procedure. The radiologist will advise you of the type of contrast selected for your procedure and direct you during the exam. You will be asked to stand, sit or lie in several different positions and to hold a small amount of fluid in  your mouth before being asked to swallow while the imaging is performed .In some instances you may be asked to swallow barium coated marshmallows to assess  the motility of a solid food bolus. The exam can be recorded as a digital or video fluoroscopy procedure. POST PROCEDURE It will take 1-2 days for the barium to pass through your system. To facilitate this, it is important, unless otherwise directed, to increase your fluids for the next 24-48hrs and to resume your normal diet.  This test typically takes about 30 minutes to perform. __________________________________________________________________________________  It was a pleasure to see you today!  Thank you for trusting me with your gastrointestinal care!

## 2024-06-04 ENCOUNTER — Ambulatory Visit

## 2024-06-05 NOTE — Progress Notes (Signed)
 Attending Physician's Attestation   I have reviewed the chart.   I agree with the Advanced Practitioner's note, impression, and recommendations with any updates as below.    Corliss Parish, MD [facility] Advanced Endoscopy Office # [phone]

## 2024-06-08 NOTE — Telephone Encounter (Signed)
 Patient has appointment already scheduled for 06/10/24 at 10:30am with Dr. De [place].

## 2024-06-10 ENCOUNTER — Encounter (HOSPITAL_BASED_OUTPATIENT_CLINIC_OR_DEPARTMENT_OTHER): Payer: Self-pay | Admitting: Family Medicine

## 2024-06-10 ENCOUNTER — Ambulatory Visit (HOSPITAL_BASED_OUTPATIENT_CLINIC_OR_DEPARTMENT_OTHER): Admitting: Family Medicine

## 2024-06-10 VITALS — BP 115/73 | HR 72 | Ht 72.0 in | Wt 207.5 lb

## 2024-06-10 DIAGNOSIS — M170 Bilateral primary osteoarthritis of knee: Secondary | ICD-10-CM | POA: Diagnosis not present

## 2024-06-10 NOTE — Progress Notes (Signed)
    Procedures performed today:    Procedure: Injection of bilateral knee joints Verbal informed consent obtained.  Time-out conducted.  Noted no overlying erythema, induration, or other signs of local infection.  Skin prepped in a sterile fashion.  Local anesthesia: Topical Ethyl chloride.  With sterile technique: 2 cc Synvisc Hylan G-F 20 injected easily.  Product number: [id number] Completed without difficulty  Advised to call if fevers/chills, erythema, induration, drainage, or persistent bleeding.  Impression: Technically successful injection.  Independent interpretation of notes and tests performed by another provider:   None.  Brief History, Exam, Impression, and Recommendations:    BP 115/73 (BP Location: Right Arm, Patient Position: Sitting, Cuff Size: Normal)   Pulse 72   Ht 6' (1.829 m)   Wt 207 lb 8 oz (94.1 kg)   SpO2 95%   BMI 28.14 kg/m   Primary osteoarthritis of both knees Assessment & Plan: Patient with osteoarthritis of the knee joints bilaterally. Radiographs show evidence of joint space narrowing, osteophytes.  This patient has knee pain which interferes with functional and activities of daily living.   This patient has experienced inadequate response, adverse effects and/or intolerance with conservative treatments such as acetaminophen , NSAIDS, topical creams, physical therapy or regular exercise.  This patient has experienced inadequate response to intra articular steroid injections for at least 3 months.  Given continued symptoms despite measures including therapy/home exercise program, intra-articular steroid injections, NSAIDs, topical creams, we discussed viscosupplementation as additional consideration, patient interested in this. Today, we completed bilateral viscosupplementation injections for bilateral knee osteoarthritis.  Procedure note above.  Patient tolerated procedure well. Will schedule for repeat injections for right second and third  administrations over the next 2 weeks.  Discussed precautions, advised to return to the office with any concerns.   Return in about 1 week (around 06/17/2024).   ___________________________________________ Rand Boller de [place], MD, ABFM, CAQSM Primary Care and Sports Medicine [facility]

## 2024-06-10 NOTE — Assessment & Plan Note (Signed)
 Patient with osteoarthritis of the knee joints bilaterally. Radiographs show evidence of joint space narrowing, osteophytes.  This patient has knee pain which interferes with functional and activities of daily living.   This patient has experienced inadequate response, adverse effects and/or intolerance with conservative treatments such as acetaminophen , NSAIDS, topical creams, physical therapy or regular exercise.  This patient has experienced inadequate response to intra articular steroid injections for at least 3 months.  Given continued symptoms despite measures including therapy/home exercise program, intra-articular steroid injections, NSAIDs, topical creams, we discussed viscosupplementation as additional consideration, patient interested in this. Today, we completed bilateral viscosupplementation injections for bilateral knee osteoarthritis.  Procedure note above.  Patient tolerated procedure well. Will schedule for repeat injections for right second and third administrations over the next 2 weeks.  Discussed precautions, advised to return to the office with any concerns.

## 2024-06-11 ENCOUNTER — Other Ambulatory Visit

## 2024-06-11 DIAGNOSIS — K5180 Other ulcerative colitis without complications: Secondary | ICD-10-CM

## 2024-06-14 ENCOUNTER — Other Ambulatory Visit (HOSPITAL_COMMUNITY): Payer: Self-pay | Admitting: Urology

## 2024-06-14 DIAGNOSIS — C61 Malignant neoplasm of prostate: Secondary | ICD-10-CM

## 2024-06-15 ENCOUNTER — Ambulatory Visit: Payer: Self-pay | Admitting: Gastroenterology

## 2024-06-15 DIAGNOSIS — K529 Noninfective gastroenteritis and colitis, unspecified: Secondary | ICD-10-CM

## 2024-06-15 DIAGNOSIS — K5150 Left sided colitis without complications: Secondary | ICD-10-CM

## 2024-06-15 LAB — CALPROTECTIN, FECAL: Calprotectin, Fecal: 296 ug/g — ABNORMAL HIGH (ref 0–120)

## 2024-06-15 MED ORDER — NA SULFATE-K SULFATE-MG SULF 17.5-3.13-1.6 GM/177ML PO SOLN: 1.0000 | Freq: Once | ORAL | 0 refills | Status: AC

## 2024-06-16 ENCOUNTER — Inpatient Hospital Stay (HOSPITAL_COMMUNITY): Admission: RE | Admit: 2024-06-16 | Source: Ambulatory Visit

## 2024-06-17 ENCOUNTER — Ambulatory Visit (HOSPITAL_BASED_OUTPATIENT_CLINIC_OR_DEPARTMENT_OTHER): Admitting: Family Medicine

## 2024-06-17 ENCOUNTER — Encounter (HOSPITAL_BASED_OUTPATIENT_CLINIC_OR_DEPARTMENT_OTHER): Payer: Self-pay | Admitting: Family Medicine

## 2024-06-17 VITALS — BP 128/78 | HR 66 | Ht 72.0 in | Wt 209.0 lb

## 2024-06-17 DIAGNOSIS — M170 Bilateral primary osteoarthritis of knee: Secondary | ICD-10-CM

## 2024-06-17 NOTE — Assessment & Plan Note (Addendum)
Patient presents today for viscosupplementation injection 2nd. Patient tolerated previous injection well. No acute concerns today. Would like to continue with viscosupplementation series today. Procedure note above. Next injection in one week.

## 2024-06-17 NOTE — Progress Notes (Signed)
    Procedures performed today:    Procedure: Injection of bilateral knee joints Verbal informed consent obtained.  Time-out conducted.  Noted no overlying erythema, induration, or other signs of local infection.  Skin prepped in a sterile fashion.  Local anesthesia: Topical Ethyl chloride.  With sterile technique: 2 cc Synvisc Hylan G-F 20 injected easily.  Product number: [id number] Completed without difficulty  Advised to call if fevers/chills, erythema, induration, drainage, or persistent bleeding.  Impression: Technically successful injection.  Independent interpretation of notes and tests performed by another provider:   None.  Brief History, Exam, Impression, and Recommendations:    BP 128/78   Pulse 66   Ht 6' (1.829 m)   Wt 209 lb (94.8 kg)   SpO2 97%   BMI 28.35 kg/m   Primary osteoarthritis of both knees Assessment & Plan: Patient presents today for viscosupplementation injection 2nd. Patient tolerated previous injection well. No acute concerns today. Would like to continue with viscosupplementation series today. Procedure note above. Next injection in one week.   Return in about 1 week (around 06/24/2024).   ___________________________________________ Luiz Trumpower de [place], MD, ABFM, CAQSM Primary Care and Sports Medicine [facility]

## 2024-06-21 ENCOUNTER — Ambulatory Visit (INDEPENDENT_AMBULATORY_CARE_PROVIDER_SITE_OTHER)

## 2024-06-21 VITALS — BP 121/68 | HR 67 | Temp 98.6°F | Resp 94 | Wt 207.0 lb

## 2024-06-21 DIAGNOSIS — J392 Other diseases of pharynx: Secondary | ICD-10-CM

## 2024-06-21 DIAGNOSIS — K219 Gastro-esophageal reflux disease without esophagitis: Secondary | ICD-10-CM | POA: Diagnosis not present
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
## 2024-06-24 ENCOUNTER — Encounter (HOSPITAL_COMMUNITY)
Admission: RE | Admit: 2024-06-24 | Discharge: 2024-06-24 | Disposition: A | Discharge status: Home / Self Care | Source: Ambulatory Visit | Attending: Urology | Admitting: Urology

## 2024-06-24 ENCOUNTER — Encounter (HOSPITAL_BASED_OUTPATIENT_CLINIC_OR_DEPARTMENT_OTHER): Payer: Self-pay | Admitting: Family Medicine

## 2024-06-24 ENCOUNTER — Ambulatory Visit (HOSPITAL_BASED_OUTPATIENT_CLINIC_OR_DEPARTMENT_OTHER): Admitting: Family Medicine

## 2024-06-24 VITALS — BP 120/77 | HR 70 | Ht 72.0 in | Wt 208.2 lb

## 2024-06-24 DIAGNOSIS — R911 Solitary pulmonary nodule: Secondary | ICD-10-CM | POA: Diagnosis not present

## 2024-06-24 DIAGNOSIS — C61 Malignant neoplasm of prostate: Secondary | ICD-10-CM | POA: Insufficient documentation

## 2024-06-24 DIAGNOSIS — M170 Bilateral primary osteoarthritis of knee: Secondary | ICD-10-CM | POA: Diagnosis not present

## 2024-06-24 MED ORDER — FLOTUFOLASTAT F 18 GALLIUM 296-5846 MBQ/ML IV SOLN
8.5000 | Freq: Once | INTRAVENOUS | Status: AC
  Administered 2024-06-24: 8.5 via INTRAVENOUS

## 2024-06-24 NOTE — Progress Notes (Signed)
    Procedures performed today:    Procedure: Injection of bilateral knee joints Verbal informed consent obtained.  Time-out conducted.  Noted no overlying erythema, induration, or other signs of local infection.  Skin prepped in a sterile fashion.  Local anesthesia: Topical Ethyl chloride.  With sterile technique: 2 cc Synvisc Hylan G-F 20 injected easily.  Product number: [id number] Completed without difficulty  Advised to call if fevers/chills, erythema, induration, drainage, or persistent bleeding.  Impression: Technically successful injection.  Independent interpretation of notes and tests performed by another provider:   None.  Brief History, Exam, Impression, and Recommendations:    BP 120/77 (BP Location: Left Arm, Patient Position: Sitting, Cuff Size: Normal)   Pulse 70   Ht 6' (1.829 m)   Wt 208 lb 3.2 oz (94.4 kg)   SpO2 98%   BMI 28.24 kg/m   Primary osteoarthritis of both knees Assessment & Plan: Patient presents today for viscosupplementation injection 3rd. Patient tolerated previous injection well. No acute concerns today. Would like to continue with viscosupplementation series today. Procedure note above. Plan to follow-up in 6 months to monitor progress.   Return in about 6 months (around 12/22/2024), or if symptoms worsen or fail to improve.   ___________________________________________ Waino Mounsey de [place], MD, ABFM, [facility]

## 2024-06-24 NOTE — Assessment & Plan Note (Addendum)
 Patient presents today for viscosupplementation injection 3rd. Patient tolerated previous injection well. No acute concerns today. Would like to continue with viscosupplementation series today. Procedure note above. Plan to follow-up in 6 months to monitor progress.

## 2024-06-26 ENCOUNTER — Other Ambulatory Visit: Payer: Self-pay | Admitting: Family Medicine

## 2024-06-28 NOTE — Telephone Encounter (Signed)
 Pls contact pt to schedule diltiazim refill appt with Dr. Alvia. Past 6 month follow-up. Sending 30 day refill. Thx

## 2024-07-05 ENCOUNTER — Ambulatory Visit: Admitting: Family Medicine

## 2024-07-06 ENCOUNTER — Ambulatory Visit (INDEPENDENT_AMBULATORY_CARE_PROVIDER_SITE_OTHER): Admitting: Family Medicine

## 2024-07-06 ENCOUNTER — Encounter: Payer: Self-pay | Admitting: Family Medicine

## 2024-07-06 VITALS — BP 136/83 | HR 77 | Ht 72.0 in | Wt 209.0 lb

## 2024-07-06 DIAGNOSIS — Z8679 Personal history of other diseases of the circulatory system: Secondary | ICD-10-CM | POA: Diagnosis not present

## 2024-07-06 DIAGNOSIS — I10 Essential (primary) hypertension: Secondary | ICD-10-CM

## 2024-07-06 DIAGNOSIS — C61 Malignant neoplasm of prostate: Secondary | ICD-10-CM | POA: Insufficient documentation

## 2024-07-06 DIAGNOSIS — K529 Noninfective gastroenteritis and colitis, unspecified: Secondary | ICD-10-CM

## 2024-07-06 MED ORDER — DILTIAZEM HCL ER COATED BEADS 240 MG PO CP24
240.0000 mg | ORAL_CAPSULE | Freq: Every day | ORAL | 1 refills | Status: DC
Start: 2024-07-06 — End: 2024-07-06

## 2024-07-06 MED ORDER — DILTIAZEM HCL ER 240 MG PO CP24: ORAL_CAPSULE | ORAL | 3 refills | Status: AC

## 2024-07-06 NOTE — Assessment & Plan Note (Signed)
 He denies any increased symptoms at this time. Continue diltiazem 

## 2024-07-06 NOTE — Assessment & Plan Note (Signed)
 Followed by GI.  Having some increased GI symptoms. Has upcoming visit for colonoscopy
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
## 2024-07-06 NOTE — Assessment & Plan Note (Signed)
BP remains well controlled.  Continue diltiazem at current strength.

## 2024-07-06 NOTE — Assessment & Plan Note (Signed)
 Recent PET does appear to show recurrence of prostate cancer.  He will f/u with urology.

## 2024-07-07 ENCOUNTER — Encounter: Payer: Self-pay | Admitting: Gastroenterology

## 2024-07-07 ENCOUNTER — Ambulatory Visit: Admitting: Gastroenterology

## 2024-07-07 VITALS — BP 115/82 | HR 115 | Temp 97.9°F | Resp 14 | Ht 72.0 in | Wt 207.0 lb

## 2024-07-07 DIAGNOSIS — K6389 Other specified diseases of intestine: Secondary | ICD-10-CM

## 2024-07-07 DIAGNOSIS — K5180 Other ulcerative colitis without complications: Secondary | ICD-10-CM

## 2024-07-07 DIAGNOSIS — K6289 Other specified diseases of anus and rectum: Secondary | ICD-10-CM | POA: Diagnosis not present

## 2024-07-07 DIAGNOSIS — K5730 Diverticulosis of large intestine without perforation or abscess without bleeding: Secondary | ICD-10-CM

## 2024-07-07 DIAGNOSIS — K641 Second degree hemorrhoids: Secondary | ICD-10-CM

## 2024-07-07 DIAGNOSIS — K644 Residual hemorrhoidal skin tags: Secondary | ICD-10-CM

## 2024-07-07 DIAGNOSIS — D175 Benign lipomatous neoplasm of intra-abdominal organs: Secondary | ICD-10-CM

## 2024-07-07 DIAGNOSIS — K5150 Left sided colitis without complications: Secondary | ICD-10-CM | POA: Diagnosis not present

## 2024-07-07 DIAGNOSIS — K529 Noninfective gastroenteritis and colitis, unspecified: Secondary | ICD-10-CM

## 2024-07-07 MED ORDER — SODIUM CHLORIDE 0.9 % IV SOLN: 500.0000 mL | Freq: Once | INTRAVENOUS | Status: DC

## 2024-07-07 NOTE — Op Note (Signed)
 [facility] Patient Name: Nicholas Garza Procedure Date: 07/07/2024 9:34 AM MRN: [id number] Endoscopist: Aloha Finner , MD, [id number] Age: 83 Referring MD:  Date of Birth: 04/09/1941 Gender: Male Account #: [id number] Procedure:                Colonoscopy Indications:              Follow-up of left-sided chronic ulcerative colitis,                            Disease activity assessment of left-sided chronic                            ulcerative colitis Medicines:                Monitored Anesthesia Care Procedure:                Pre-Anesthesia Assessment:                           - Prior to the procedure, a History and Physical                            was performed, and patient medications and                            allergies were reviewed. The patient's tolerance of                            previous anesthesia was also reviewed. The risks                            and benefits of the procedure and the sedation                            options and risks were discussed with the patient.                            All questions were answered, and informed consent                            was obtained. Prior Anticoagulants: The patient has                            taken no anticoagulant or antiplatelet agents                            except for aspirin. ASA Grade Assessment: III - A                            patient with severe systemic disease. After                            reviewing the risks and benefits, the patient was  deemed in satisfactory condition to undergo the                            procedure.                           After obtaining informed consent, the colonoscope                            was passed under direct vision. Throughout the                            procedure, the patient's blood pressure, pulse, and                            oxygen saturations were monitored continuously. The                             CF [id number] #[id number] was introduced through the anus                            and advanced to the 5 cm into the ileum. The                            colonoscopy was performed without difficulty. The                            patient tolerated the procedure. The quality of the                            bowel preparation was adequate. The terminal ileum,                            ileocecal valve, appendiceal orifice, and rectum                            were photographed. Scope In: 9:59:05 AM Scope Out: 10:12:09 AM Scope Withdrawal Time: 0 hours 9 minutes 22 seconds  Total Procedure Duration: 0 hours 13 minutes 4 seconds  Findings:                 The digital rectal exam findings include                            hemorrhoids. Pertinent negatives include no                            palpable rectal lesions.                           The terminal ileum and ileocecal valve appeared                            normal.  There was a medium-sized lipoma, in the ascending                            colon.                           Many medium-mouthed and small-mouthed diverticula                            were found in the entire colon (left-sided                            diverticular burden is greater though than rest of                            colon).                           Normal mucosa was found in the descending colon, at                            the splenic flexure, in the transverse colon, at                            the hepatic flexure, in the ascending colon, in the                            cecum and at the appendiceal orifice. Biopsies were                            taken with a cold forceps for histology from the                            right colon.                           A segmental area of mildly erythematous mucosa was                            found in the recto-sigmoid colon and in the sigmoid                             colon. Biopsies were taken with a cold forceps for                            histology from the left colon.                           Normal mucosa was found in the rectum. Biopsies                            were taken with a cold forceps for histology.  Non-bleeding non-thrombosed internal hemorrhoids                            were found during retroflexion, during perianal                            exam and during digital exam. The hemorrhoids were                            Grade II (internal hemorrhoids that prolapse but                            reduce spontaneously). Complications:            No immediate complications. Estimated Blood Loss:     Estimated blood loss was minimal. Impression:               - Hemorrhoids found on digital rectal exam.                           - The examined portion of the ileum was normal.                           - Lipoma noted in the ascending colon.                           - Diverticulosis in the entire examined colon                            (Left-sided burden > Right-sided burden).                           - Normal mucosa in the descending colon, at the                            splenic flexure, in the transverse colon, at the                            hepatic flexure, in the ascending colon, in the                            cecum and at the appendiceal orifice. Biopsied.                           - Erythematous mucosa in the recto-sigmoid colon                            and in the sigmoid colon. Biopsied.                           - Normal mucosa in the rectum. Biopsied.                           - Non-bleeding non-thrombosed internal hemorrhoids. Recommendation:           - The patient  will be observed post-procedure,                            until all discharge criteria are met.                           - Discharge patient to home.                           - Patient has a contact number  available for                            emergencies. The signs and symptoms of potential                            delayed complications were discussed with the                            patient. Return to normal activities tomorrow.                            Written discharge instructions were provided to the                            patient.                           - High fiber diet.                           - Use FiberCon 1-2 tablets PO daily.                           - Continue present medications.                           - Await pathology results.                           - Repeat colonoscopy for surveillance to be                            determined.                           - The findings and recommendations were discussed                            with the patient.                           - The findings and recommendations were discussed                            with the designated responsible adult. Aloha Finner, MD 07/07/2024 10:19:12 AM

## 2024-07-07 NOTE — Progress Notes (Signed)
 Called to room to assist during endoscopic procedure.  Patient ID and intended procedure confirmed with present staff. Received instructions for my participation in the procedure from the performing physician.
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
## 2024-07-07 NOTE — Progress Notes (Signed)
 Prior to us  starting the procedure, patient went into a sinus tachycardia.  He described that he had not taken his diltiazem  over the course the last 2 to 3 days (although this was not recommended or placed in his instructions). Heart rates were in the 1 teens very low 120s. Hemodynamically stable without any chest pain/chest pressure/palpitations/shortness of breath. Please see anesthesia notes for administration of beta-blockade. This allowed the patient to have improvement in his heart rates which was monitored before we initiated his procedure. During the colonoscopy he went in and out of sinus tachycardia into the 1 teens.  This was not retreated. Patient is in recovery and is hemodynamically stable. Pending patient not having any symptoms of chest pain/chest pressure/shortness of breath or palpitations that are concerning for him, he will be able to be discharged pending no other complications. He has been instructed to reinitiate his diltiazem  this afternoon as soon as he gets home. He will be instructed that should he develop any of the symptoms or continues to have significant tachycardia, he will need to come to the emergency room for further evaluation. The patient agrees with this plan of action.  Aloha Finner, MD [facility] Office # [phone]

## 2024-07-07 NOTE — Progress Notes (Signed)
 Patient denies any chest pain, shortness of breath, or any other sign/ symptom upon discharge.  Reminded patient to take Diltiazem  when he got home. If he has any chest pain, SOB, or other signs and symptoms, he needs to go to the ER. Patient verbalizes understanding.

## 2024-07-07 NOTE — Progress Notes (Signed)
 Nad pt in atrial tach pt awake and talking with staff.  Trans to pacu report to [employer] RN

## 2024-07-07 NOTE — Patient Instructions (Addendum)
 Educational handout provided to patient related to Hemorrhoids, Diverticulosis, high fiber diet  High Fiber diet  Continue present medications  Awaiting pathology results  YOU HAD AN ENDOSCOPIC PROCEDURE TODAY AT THE [facility]:   Refer to the procedure report that was given to you for any specific questions about what was found during the examination.  If the procedure report does not answer your questions, please call your gastroenterologist to clarify.  If you requested that your care partner not be given the details of your procedure findings, then the procedure report has been included in a sealed envelope for you to review at your convenience later.  YOU SHOULD EXPECT: Some feelings of bloating in the abdomen. Passage of more gas than usual.  Walking can help get rid of the air that was put into your GI tract during the procedure and reduce the bloating. If you had a lower endoscopy (such as a colonoscopy or flexible sigmoidoscopy) you may notice spotting of blood in your stool or on the toilet paper. If you underwent a bowel prep for your procedure, you may not have a normal bowel movement for a few days.  Please Note:  You might notice some irritation and congestion in your nose or some drainage.  This is from the oxygen used during your procedure.  There is no need for concern and it should clear up in a day or so.  SYMPTOMS TO REPORT IMMEDIATELY:  Following lower endoscopy (colonoscopy or flexible sigmoidoscopy):  Excessive amounts of blood in the stool  Significant tenderness or worsening of abdominal pains  Swelling of the abdomen that is new, acute  Fever of 100F or higher  For urgent or emergent issues, a gastroenterologist can be reached at any hour by calling [phone]. Do not use MyChart messaging for urgent concerns.    DIET:  We do recommend a small meal at first, but then you may proceed to your regular diet.  Drink plenty of fluids but you should  avoid alcoholic beverages for 24 hours.  ACTIVITY:  You should plan to take it easy for the rest of today and you should NOT DRIVE or use heavy machinery until tomorrow (because of the sedation medicines used during the test).    FOLLOW UP: Our staff will call the number listed on your records the next business day following your procedure.  We will call around 7:15- 8:00 am to check on you and address any questions or concerns that you may have regarding the information given to you following your procedure. If we do not reach you, we will leave a message.     If any biopsies were taken you will be contacted by phone or by letter within the next 1-3 weeks.  Please call us  at [phone] if you have not heard about the biopsies in 3 weeks.    SIGNATURES/CONFIDENTIALITY: You and/or your care partner have signed paperwork which will be entered into your electronic medical record.  These signatures attest to the fact that that the information above on your After Visit Summary has been reviewed and is understood.  Full responsibility of the confidentiality of this discharge information lies with you and/or your care-partner.

## 2024-07-07 NOTE — Progress Notes (Signed)
 9051:  pt from NSR to Atrial tach at 125-130.  BP 146/93  Esmolol given no effect.  Labetolol given.  5mg  Metoprolol given NSR at 0958.  Pt reported he hasn't taken his diltiazem  in 2 days.    1001:  Back in atrial tach of 115BPM  1009:  Back in NSR 62BPM BP 115/79

## 2024-07-08 ENCOUNTER — Telehealth: Payer: Self-pay

## 2024-07-08 NOTE — Telephone Encounter (Signed)
 Attempted f/u call. No answer, left VM.

## 2024-07-13 LAB — SURGICAL PATHOLOGY

## 2024-07-14 ENCOUNTER — Ambulatory Visit: Payer: Self-pay | Admitting: Gastroenterology

## 2024-07-23 ENCOUNTER — Encounter: Payer: Self-pay | Admitting: Gastroenterology

## 2024-07-23 ENCOUNTER — Ambulatory Visit: Admitting: Gastroenterology

## 2024-07-23 VITALS — BP 140/80 | HR 119 | Ht 72.0 in | Wt 207.4 lb

## 2024-07-23 DIAGNOSIS — K5150 Left sided colitis without complications: Secondary | ICD-10-CM | POA: Diagnosis not present

## 2024-07-23 DIAGNOSIS — R152 Fecal urgency: Secondary | ICD-10-CM

## 2024-07-23 NOTE — Patient Instructions (Addendum)
 Avoid dairy and fried foods.   Follow a high fiber diet.   Continue Lialda  at current dose.   Call if new or worsening symptoms  Follow up in 6 months with Dr Wilhelmenia
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
## 2024-07-26 NOTE — Progress Notes (Signed)
 Attending Physician's Attestation   I have reviewed the chart.   I agree with the Advanced Practitioner's note, impression, and recommendations with any updates as below.    Corliss Parish, MD [facility] Advanced Endoscopy Office # [phone]

## 2024-08-28 ENCOUNTER — Other Ambulatory Visit: Payer: Self-pay | Admitting: Gastroenterology

## 2024-08-29 ENCOUNTER — Other Ambulatory Visit: Payer: Self-pay | Admitting: Gastroenterology

## 2024-11-04 ENCOUNTER — Other Ambulatory Visit: Payer: Self-pay | Admitting: Gastroenterology

## 2024-11-05 ENCOUNTER — Other Ambulatory Visit: Payer: Self-pay | Admitting: Family Medicine

## 2024-11-05 DIAGNOSIS — Z8546 Personal history of malignant neoplasm of prostate: Secondary | ICD-10-CM

## 2024-11-05 DIAGNOSIS — R351 Nocturia: Secondary | ICD-10-CM

## 2024-11-11 ENCOUNTER — Ambulatory Visit

## 2024-12-23 ENCOUNTER — Ambulatory Visit (HOSPITAL_BASED_OUTPATIENT_CLINIC_OR_DEPARTMENT_OTHER): Admitting: Family Medicine

## 2024-12-23 ENCOUNTER — Ambulatory Visit: Admitting: Family Medicine

## 2025-01-04 ENCOUNTER — Ambulatory Visit: Admitting: Family Medicine
